# Patient Record
Sex: Female | Born: 1995 | Race: White | Hispanic: No | Marital: Married | Smoking: Never smoker
Health system: Southern US, Community
[De-identification: ages and names within clinical notes are randomized; demographics above are authoritative.]

## PROBLEM LIST (undated history)

## (undated) ENCOUNTER — Inpatient Hospital Stay (HOSPITAL_COMMUNITY): Payer: Self-pay

## (undated) DIAGNOSIS — Z5189 Encounter for other specified aftercare: Secondary | ICD-10-CM

## (undated) DIAGNOSIS — I10 Essential (primary) hypertension: Secondary | ICD-10-CM

## (undated) DIAGNOSIS — O09299 Supervision of pregnancy with other poor reproductive or obstetric history, unspecified trimester: Secondary | ICD-10-CM

## (undated) DIAGNOSIS — Z6841 Body Mass Index (BMI) 40.0 and over, adult: Secondary | ICD-10-CM

## (undated) DIAGNOSIS — O139 Gestational [pregnancy-induced] hypertension without significant proteinuria, unspecified trimester: Secondary | ICD-10-CM

## (undated) HISTORY — DX: Encounter for other specified aftercare: Z51.89

## (undated) HISTORY — DX: Supervision of pregnancy with other poor reproductive or obstetric history, unspecified trimester: O09.299

## (undated) HISTORY — PX: TONSILLECTOMY: SUR1361

## (undated) HISTORY — PX: KNEE ARTHROSCOPY WITH ANTERIOR CRUCIATE LIGAMENT (ACL) REPAIR: SHX5644

## (undated) HISTORY — PX: WISDOM TOOTH EXTRACTION: SHX21

## (undated) HISTORY — DX: Gestational (pregnancy-induced) hypertension without significant proteinuria, unspecified trimester: O13.9

## (undated) HISTORY — PX: KNEE SURGERY: SHX244

## (undated) HISTORY — DX: Essential (primary) hypertension: I10

---
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
## 2017-05-24 ENCOUNTER — Encounter: Payer: Self-pay | Admitting: *Deleted

## 2017-05-24 DIAGNOSIS — R1012 Left upper quadrant pain: Secondary | ICD-10-CM | POA: Insufficient documentation

## 2017-05-24 LAB — POCT PREGNANCY, URINE: PREG TEST UR: NEGATIVE

## 2017-05-24 NOTE — ED Triage Notes (Signed)
Pt to triage via wheelchair.  Pt has left side abd pain.  Sx for weeks, pain worse today.  No n/v/d.  No urinary sx.  No back pain.  Pt alert and tearful

## 2017-05-25 ENCOUNTER — Emergency Department
Admission: EM | Admit: 2017-05-25 | Discharge: 2017-05-25 | Disposition: A | Discharge status: Home / Self Care | Payer: BC Managed Care – PPO | Attending: Emergency Medicine | Admitting: Emergency Medicine

## 2017-05-25 ENCOUNTER — Emergency Department: Payer: BC Managed Care – PPO

## 2017-05-25 DIAGNOSIS — R109 Unspecified abdominal pain: Secondary | ICD-10-CM

## 2017-05-25 LAB — COMPREHENSIVE METABOLIC PANEL
ALT: 22 U/L (ref 14–54)
AST: 26 U/L (ref 15–41)
Albumin: 4.1 g/dL (ref 3.5–5.0)
Alkaline Phosphatase: 76 U/L (ref 38–126)
Anion gap: 10 (ref 5–15)
BUN: 13 mg/dL (ref 6–20)
CO2: 22 mmol/L (ref 22–32)
Calcium: 9.3 mg/dL (ref 8.9–10.3)
Chloride: 107 mmol/L (ref 101–111)
Creatinine, Ser: 0.63 mg/dL (ref 0.44–1.00)
GFR calc Af Amer: >60 mL/min (ref >60)
GFR calc non Af Amer: >60 mL/min (ref >60)
Glucose, Bld: 101 mg/dL — ABNORMAL HIGH (ref 65–99)
Potassium: 4 mmol/L (ref 3.5–5.1)
Sodium: 139 mmol/L (ref 135–145)
Total Bilirubin: 0.5 mg/dL (ref 0.3–1.2)
Total Protein: 7 g/dL (ref 6.5–8.1)

## 2017-05-25 LAB — URINALYSIS, COMPLETE (UACMP) WITH MICROSCOPIC
Bacteria, UA: NONE SEEN
Bilirubin Urine: NEGATIVE
Glucose, UA: NEGATIVE mg/dL
Hgb urine dipstick: NEGATIVE
Ketones, ur: NEGATIVE mg/dL
Leukocytes, UA: NEGATIVE
Nitrite: NEGATIVE
Protein, ur: NEGATIVE mg/dL
RBC / HPF: NONE SEEN RBC/hpf (ref 0–5)
Specific Gravity, Urine: 1.017 (ref 1.005–1.030)
WBC, UA: NONE SEEN WBC/hpf (ref 0–5)
pH: 6 (ref 5.0–8.0)

## 2017-05-25 LAB — CBC
HCT: 39.3 % (ref 35.0–47.0)
Hemoglobin: 13.3 g/dL (ref 12.0–16.0)
MCH: 27.5 pg (ref 26.0–34.0)
MCHC: 33.9 g/dL (ref 32.0–36.0)
MCV: 81.2 fL (ref 80.0–100.0)
Platelets: 229 10*3/uL (ref 150–440)
RBC: 4.84 MIL/uL (ref 3.80–5.20)
RDW: 14 % (ref 11.5–14.5)
WBC: 8.1 10*3/uL (ref 3.6–11.0)

## 2017-05-25 LAB — LIPASE, BLOOD: Lipase: 29 U/L (ref 11–51)

## 2017-05-25 NOTE — ED Notes (Signed)
Pt resting comfortably

## 2017-05-25 NOTE — Discharge Instructions (Signed)

## 2017-05-25 NOTE — ED Provider Notes (Signed)
[facility] Emergency Department Provider Note  ____________________________________________   First MD Initiated Contact with Patient 05/25/17 0114     (approximate)  I have reviewed the triage vital signs and the nursing notes.   HISTORY  Chief Complaint Abdominal Pain    HPI Pamela Munoz is a 21 y.o. female who presents for evaluation of acute onset left sided abdominal pain in the setting of 2 weeks of mild abdominal pain.  She reports that she has had some mild aching left sided abdominal pain that is dull and persistent but "does not bother me too much" for about 2 weeks.  Tonight, however, several hours prior to arrival she had acute onset of severe sharp and aching pain in the left side of her abdomen.  Nothing in particular makes the patient's symptoms better nor worse.  It feels better now but she can still feel the pain.  She denies fever/chills, chest pain, shortness of breath, nausea or vomiting, and dysuria.  She has had normal bowel movements recently with no diarrhea and no constipation.  She denies vaginal discharge.  Her last pressure.  Was 2 weeks ago.  She has never been sexually active.   No past medical history on file.  There are no active problems to display for this patient.   No past surgical history on file.  Prior to Admission medications   Not on File    Allergies Patient has no known allergies.  No family history on file.  Social History Social History  Substance Use Topics  . Smoking status: Never Smoker  . Smokeless tobacco: Never Used  . Alcohol use No    Review of Systems Constitutional: No fever/chills Eyes: No visual changes. ENT: No sore throat. Cardiovascular: Denies chest pain. Respiratory: Denies shortness of breath. Gastrointestinal: 2 weeks of mild left-sided abdominal pain with acute onset severe pain tonight, now improved but still present.  No nausea, no vomiting.  No diarrhea.  No  constipation. Genitourinary: Negative for dysuria.  No vaginal bleeding since last menstrual period 2 weeks ago. Musculoskeletal: Negative for neck pain.  Negative for back pain. Integumentary: Negative for rash. Neurological: Negative for headaches, focal weakness or numbness.   ____________________________________________   PHYSICAL EXAM:  VITAL SIGNS: ED Triage Vitals  Enc Vitals Group     BP 05/24/17 2344 119/85     Pulse Rate 05/24/17 2344 77     Resp 05/24/17 2344 18     Temp 05/24/17 2344 97.9 F (36.6 C)     Temp Source 05/24/17 2344 Oral     SpO2 05/24/17 2344 97 %     Weight 05/24/17 2344 90.7 kg (200 lb)     Height 05/24/17 2344 1.676 m (5\' 6" )     Head Circumference --      Peak Flow --      Pain Score 05/24/17 2343 8     Pain Loc --      Pain Edu? --      Excl. in [place]? --     Constitutional: Alert and oriented. Well appearing and in no acute distress. Eyes: Conjunctivae are normal.  Head: Atraumatic. Nose: No congestion/rhinnorhea. Mouth/Throat: Mucous membranes are moist. Neck: No stridor.  No meningeal signs.   Cardiovascular: Normal rate, regular rhythm. Good peripheral circulation. Grossly normal heart sounds. Respiratory: Normal respiratory effort.  No retractions. Lungs CTAB. Gastrointestinal: Soft with mild tenderness to palpation of the left lower abdomen without any specific focal tenderness.  No rebound  and no guarding GU:  deferred at patient/family preference Musculoskeletal: No lower extremity tenderness nor edema. No gross deformities of extremities.  No CVA tenderness Neurologic:  Normal speech and language. No gross focal neurologic deficits are appreciated.  Skin:  Skin is warm, dry and intact. No rash noted. Psychiatric: Mood and affect are normal. Speech and behavior are normal.  ____________________________________________   LABS (all labs ordered are listed, but only abnormal results are displayed)  Labs Reviewed  COMPREHENSIVE  METABOLIC PANEL - Abnormal; Notable for the following:       Result Value   Glucose, Bld 101 (*)    All other components within normal limits  URINALYSIS, COMPLETE (UACMP) WITH MICROSCOPIC - Abnormal; Notable for the following:    Color, Urine YELLOW (*)    APPearance CLEAR (*)    Squamous Epithelial / LPF 0-5 (*)    All other components within normal limits  LIPASE, BLOOD  CBC  POC URINE PREG, ED  POCT PREGNANCY, URINE   ____________________________________________  EKG  None - EKG not ordered by ED physician ____________________________________________  RADIOLOGY   US Pelvis Complete  Result Date: 05/25/2017 CLINICAL DATA:  Initial evaluation for acute left-sided abdominal pain for several weeks. EXAM: TRANSABDOMINAL ULTRASOUND OF PELVIS DOPPLER ULTRASOUND OF OVARIES TECHNIQUE: Transabdominal ultrasound examination of the pelvis was performed including evaluation of the uterus, ovaries, adnexal regions, and pelvic cul-de-sac. Color and duplex Doppler ultrasound was utilized to evaluate blood flow to the ovaries. Both transabdominal and transvaginal ultrasound examinations of the pelvis were performed. Transabdominal technique was performed for global imaging of the pelvis including uterus, ovaries, adnexal regions, and pelvic cul-de-sac. It was necessary to proceed with endovaginal exam following the transabdominal exam to visualize the uterus and ovaries. COMPARISON:  None. FINDINGS: Uterus Measurements: 6.9 x 2.9 x 4.1 cm. No fibroids or other mass visualized. Endometrium Thickness: 6.4 mm. No focal abnormality visualized. Right ovary Measurements: 5.1 x 2.3 x 3.2 cm. 2.7 x 2.5 x 2.5 cm simple anechoic cyst, most consistent with a normal physiologic cyst. Left ovary Measurements: 3.0 x 2.4 x 2.2 cm. Normal appearance/no adnexal mass. Pulsed Doppler evaluation demonstrates normal low-resistance arterial and venous waveforms in both ovaries. No abnormal free fluid within the pelvis.  IMPRESSION: 1. 2.7 x 2.5 x 2.5 cm simple right ovarian cyst, consistent with a normal physiologic cyst. 2. Otherwise unremarkable pelvic ultrasound. No acute abnormality identified. Electronically Signed   By: Jeannine Boga M.D.   On: 05/25/2017 03:33   [place] Art/ven Flow Abd Pelv Doppler  Result Date: 05/25/2017 CLINICAL DATA:  Initial evaluation for acute left-sided abdominal pain for several weeks. EXAM: TRANSABDOMINAL ULTRASOUND OF PELVIS DOPPLER ULTRASOUND OF OVARIES TECHNIQUE: Transabdominal ultrasound examination of the pelvis was performed including evaluation of the uterus, ovaries, adnexal regions, and pelvic cul-de-sac. Color and duplex Doppler ultrasound was utilized to evaluate blood flow to the ovaries. Both transabdominal and transvaginal ultrasound examinations of the pelvis were performed. Transabdominal technique was performed for global imaging of the pelvis including uterus, ovaries, adnexal regions, and pelvic cul-de-sac. It was necessary to proceed with endovaginal exam following the transabdominal exam to visualize the uterus and ovaries. COMPARISON:  None. FINDINGS: Uterus Measurements: 6.9 x 2.9 x 4.1 cm. No fibroids or other mass visualized. Endometrium Thickness: 6.4 mm. No focal abnormality visualized. Right ovary Measurements: 5.1 x 2.3 x 3.2 cm. 2.7 x 2.5 x 2.5 cm simple anechoic cyst, most consistent with a normal physiologic cyst. Left ovary Measurements: 3.0 x 2.4 x 2.2  cm. Normal appearance/no adnexal mass. Pulsed Doppler evaluation demonstrates normal low-resistance arterial and venous waveforms in both ovaries. No abnormal free fluid within the pelvis. IMPRESSION: 1. 2.7 x 2.5 x 2.5 cm simple right ovarian cyst, consistent with a normal physiologic cyst. 2. Otherwise unremarkable pelvic ultrasound. No acute abnormality identified. Electronically Signed   By: Jeannine Boga M.D.   On: 05/25/2017 03:33     ____________________________________________   PROCEDURES  Critical Care performed: No   Procedure(s) performed:   Procedures   ____________________________________________   INITIAL IMPRESSION / ASSESSMENT AND PLAN / ED COURSE  Pertinent labs & imaging results that were available during my care of the patient were reviewed by me and considered in my medical decision making (see chart for details).  Differential is broad as it is for any young woman with abdominal pain.  However the sure aspects of her situation are that she has been having pain for 2 weeks with acute onset worsening pain tonight, has normal vital signs, and has normal lab work.  We discussed various things that could be going  On and I explained that ovarian cause is most likely.  I ordered standard ultrasound including pelvis complete and transvaginal, but the patient has not had sexual intercourse and ultrasound protocol does not allow transvaginal ultrasound in that situation.  Awaiting results of transabdominal pelvic ultrasound.  Clinical Course as of May 25 452  Thu May 25, 2017  0420 The patient is sleeping comfortably.  When I woke her she states that she "just feels sick" but her stomach is not bothering her as much.  I reassessed her tenderness and she still has some mild tenderness to palpation of the far left lateral side of her abdomen but it remains nonfocal and she has no rebound or guarding.  I had another long discussion with her and her mother and I explained the possibility of things such as early appendicitis but I also explained why I did not think this was the case based on her normal vital signs including being afebrile, her normal labs, and the location of the pain.  Based on her reassuring ultrasound, I explained that I could go ahead and order a CT scan of her abdomen and pelvis or we could discharge for her to use ibuprofen and Tylenol and follow up as an outpatient with strict return  precautions if she were to develop new or worsening symptoms.  I also explained my concerns about the radiation risk for a 21 year old woman with a low probability of a severe intra-abdominal pathology.  Both the patient and her mother are comfortable with the plan for outpatient follow-up and promise to return if she worsens.  [CF]    Clinical Course User Index [CF] Hinda Kehr, MD    ____________________________________________  FINAL CLINICAL IMPRESSION(S) / ED DIAGNOSES  Final diagnoses:  Left sided abdominal pain     MEDICATIONS GIVEN DURING THIS VISIT:  Medications - No data to display   NEW OUTPATIENT MEDICATIONS STARTED DURING THIS VISIT:  There are no discharge medications for this patient.   There are no discharge medications for this patient.   There are no discharge medications for this patient.    Note:  This document was prepared using Dragon voice recognition software and may include unintentional dictation errors.    Hinda Kehr, MD 05/25/17 [phone]

## 2017-07-03 ENCOUNTER — Encounter: Payer: Self-pay | Admitting: Emergency Medicine

## 2017-07-03 ENCOUNTER — Ambulatory Visit
Admission: EM | Admit: 2017-07-03 | Discharge: 2017-07-03 | Disposition: A | Discharge status: Home / Self Care | Payer: BC Managed Care – PPO | Attending: Family Medicine | Admitting: Family Medicine

## 2017-07-03 ENCOUNTER — Encounter: Payer: Self-pay | Admitting: *Deleted

## 2017-07-03 DIAGNOSIS — M25561 Pain in right knee: Secondary | ICD-10-CM

## 2017-07-03 DIAGNOSIS — Z87828 Personal history of other (healed) physical injury and trauma: Secondary | ICD-10-CM

## 2017-07-03 NOTE — Discharge Instructions (Signed)
Rest. Ice. Use knee immobilizer and crutches. Keep follow-up appointment for tomorrow as scheduled.  Follow up with your primary care physician this week as needed. Return to Urgent care for new or worsening concerns.

## 2017-07-03 NOTE — ED Provider Notes (Signed)
[facility] ____________________________________________  Time seen: Approximately 9:05 AM  I have reviewed the triage vital signs and the nursing notes.   HISTORY  Chief Complaint Knee Pain (right)   HPI Pamela Munoz is a 21 y.o. female presenting with father at bedside for evaluation of right knee pain has been present for approximately 2 hours. Patient father reports that they were exercising and she was standing on top of a tire, and twisted her right knee causing her to fall. States when she twisted her knee she heard a loud popping noise, and then fell as she could not weight-bear. Denies any fall directly to the knee or direct trauma to the knee. States that she caught herself with her hands and then fell backwards in a sitting position. Patient states that she has been unable to weight-bear on her right knee. States history of previous anterior cruciate ligament repair in 2012, and post physical therapy no chronic issues. States current pain is similar to previous in regards to anterior cruciate ligament tear. Did take otc tylenol prior to arrival which helped some. States with knee sitting still only has minimal pain, states moderate pain with attempted weightbearing. Has been using crutches that she has a home.  Denies other pain or injury. Denies head injury or loss consciousness. Denies paresthesias or pain radiation. Denies other complaints. Denies recent sickness. Denies recent antibiotic use.   Patient's last menstrual period was 06/05/2017 (approximate).Denies pregnancy.    History reviewed. No pertinent past medical history. denies There are no active problems to display for this patient.   Past Surgical History:  Procedure Laterality Date  . KNEE ARTHROSCOPY WITH ANTERIOR CRUCIATE LIGAMENT (ACL) REPAIR Right   repaired by Rudene Christians   No current facility-administered medications for this encounter.  No current outpatient prescriptions on  file.  Allergies Patient has no known allergies.  History reviewed. No pertinent family history.  Social History Social History  Substance Use Topics  . Smoking status: Never Smoker  . Smokeless tobacco: Never Used  . Alcohol use No    Review of Systems Constitutional: No fever/chills Cardiovascular: Denies chest pain. Respiratory: Denies shortness of breath. Gastrointestinal: No abdominal pain.   Musculoskeletal: Negative for back pain. Skin: Negative for rash.  ____________________________________________   PHYSICAL EXAM:  VITAL SIGNS: ED Triage Vitals  Enc Vitals Group     BP 07/03/17 0843 133/63     Pulse Rate 07/03/17 0843 89     Resp 07/03/17 0843 14     Temp 07/03/17 0843 97.6 F (36.4 C)     Temp Source 07/03/17 0843 Oral     SpO2 07/03/17 0843 100 %     Weight 07/03/17 0841 220 lb (99.8 kg)     Height 07/03/17 0841 5\' 6"  (1.676 m)     Head Circumference --      Peak Flow --      Pain Score 07/03/17 0841 8     Pain Loc --      Pain Edu? --      Excl. in [place]? --     Constitutional: Alert and oriented. Well appearing and in no acute distress.      Head: Normocephalic and atraumatic. Cardiovascular: Normal rate, regular rhythm. Grossly normal heart sounds.  Good peripheral circulation. Respiratory: Normal respiratory effort without tachypnea nor retractions. Breath sounds are clear and equal bilaterally. No wheezes, rales, rhonchi. Musculoskeletal: No midline cervical, thoracic or lumbar tenderness to palpation. Bilateral pedal pulses equal and easily palpated. Except:  Right knee no erythema or edema noted on visualization, mild diffuse anterior tenderness palpation as well as posterior tenderness, moderate pain with anterior and posterior drawer test, no pain with lateral stress, mild pain with medial stress, no point bony tenderness uto palpation, nable to fully extend right knee, right lower extremity otherwise nontender and with normal distal  sensation. Neurologic:  Normal speech and language.Speech is normal.  Skin:  Skin is warm, dry. Psychiatric: Mood and affect are normal. Speech and behavior are normal. Patient exhibits appropriate insight and judgment   ___________________________________________   LABS (all labs ordered are listed, but only abnormal results are displayed)  Labs Reviewed - No data to display ____________________________________________  RADIOLOGY  No results found. _Declined ___________________________________________   PROCEDURES Procedures  right knee immobilizer applied by RN.  INITIAL IMPRESSION / ASSESSMENT AND PLAN / ED COURSE  Pertinent labs & imaging results that were available during my care of the patient were reviewed by me and considered in my medical decision making (see chart for details).  Well-appearing patient. Presents for evaluation of right knee pain that occurred prior to arrival. History of similar pain in past associated with anterior cruciate ligament tear. No point bony tenderness. Denies direct injury or hitting to right knee. States injury and pain secondary to twisting with associated popping injury. Discussed in detail with patient and mother, concerned suspect ligamentous injury. RN called and was able to obtain appointment with orthopedic Rachelle Hora PA tomorrow at 3 PM. Discussed the patient and father evaluation of x-ray today to exclude bony abnormality, patient and father declines xray at this time and states they will obtain x-ray tomorrow at orthopedist if needed. Knee immobilizer applied. Directed to continue using crutches. Denies need for pain medication. Ice and elevation and remain nonweightbearing area to work note given for today and tomorrow.  Discussed follow up with Primary care physician this week. Discussed follow up and return parameters including no resolution or any worsening concerns. Patient verbalized understanding and agreed to plan.    ____________________________________________   FINAL CLINICAL IMPRESSION(S) / ED DIAGNOSES  Final diagnoses:  Acute pain of right knee  History of tear of ACL (anterior cruciate ligament)     There are no discharge medications for this patient.   Note: This dictation was prepared with Dragon dictation along with smaller phrase technology. Any transcriptional errors that result from this process are unintentional.         Marylene Land, NP 07/03/17 1110

## 2017-07-03 NOTE — ED Triage Notes (Signed)
Patient c/o pain in her right knee after doing some excercise and twisted her knee wrong this morning.

## 2017-07-05 ENCOUNTER — Other Ambulatory Visit: Payer: Self-pay | Admitting: Orthopedic Surgery

## 2017-07-05 DIAGNOSIS — M25561 Pain in right knee: Secondary | ICD-10-CM

## 2017-07-06 ENCOUNTER — Ambulatory Visit
Admission: RE | Admit: 2017-07-06 | Discharge: 2017-07-06 | Disposition: A | Discharge status: Home / Self Care | Payer: BC Managed Care – PPO | Source: Ambulatory Visit | Attending: Orthopedic Surgery | Admitting: Orthopedic Surgery

## 2017-07-06 DIAGNOSIS — M25561 Pain in right knee: Secondary | ICD-10-CM | POA: Insufficient documentation

## 2017-07-06 DIAGNOSIS — S83421A Sprain of lateral collateral ligament of right knee, initial encounter: Secondary | ICD-10-CM | POA: Insufficient documentation

## 2017-07-06 DIAGNOSIS — M6751 Plica syndrome, right knee: Secondary | ICD-10-CM | POA: Diagnosis not present

## 2017-07-06 DIAGNOSIS — Z9889 Other specified postprocedural states: Secondary | ICD-10-CM | POA: Insufficient documentation

## 2017-07-06 DIAGNOSIS — M25461 Effusion, right knee: Secondary | ICD-10-CM | POA: Diagnosis present

## 2017-07-06 DIAGNOSIS — S83241A Other tear of medial meniscus, current injury, right knee, initial encounter: Secondary | ICD-10-CM | POA: Diagnosis not present

## 2017-07-14 ENCOUNTER — Other Ambulatory Visit: Payer: Self-pay | Admitting: Orthopedic Surgery

## 2017-07-14 DIAGNOSIS — M25561 Pain in right knee: Secondary | ICD-10-CM

## 2017-07-21 ENCOUNTER — Ambulatory Visit
Admission: RE | Admit: 2017-07-21 | Discharge: 2017-07-21 | Disposition: A | Discharge status: Home / Self Care | Payer: BC Managed Care – PPO | Source: Ambulatory Visit | Attending: Orthopedic Surgery | Admitting: Orthopedic Surgery

## 2017-07-21 DIAGNOSIS — M25561 Pain in right knee: Secondary | ICD-10-CM | POA: Insufficient documentation

## 2017-07-21 DIAGNOSIS — Z9689 Presence of other specified functional implants: Secondary | ICD-10-CM | POA: Diagnosis not present

## 2017-07-21 DIAGNOSIS — Z9889 Other specified postprocedural states: Secondary | ICD-10-CM | POA: Insufficient documentation

## 2017-07-26 ENCOUNTER — Encounter: Payer: Self-pay | Admitting: *Deleted

## 2017-07-26 ENCOUNTER — Ambulatory Visit: Payer: BC Managed Care – PPO

## 2017-08-03 ENCOUNTER — Encounter: Payer: Self-pay | Admitting: Anesthesiology

## 2017-08-03 ENCOUNTER — Ambulatory Visit: Payer: BC Managed Care – PPO | Admitting: Anesthesiology

## 2017-08-03 ENCOUNTER — Encounter
Admission: RE | Disposition: A | Discharge status: Home / Self Care | Payer: Self-pay | Source: Ambulatory Visit | Attending: Orthopedic Surgery

## 2017-08-03 ENCOUNTER — Ambulatory Visit
Admission: RE | Admit: 2017-08-03 | Discharge: 2017-08-03 | Disposition: A | Discharge status: Home / Self Care | Payer: BC Managed Care – PPO | Source: Ambulatory Visit | Attending: Orthopedic Surgery | Admitting: Orthopedic Surgery

## 2017-08-03 DIAGNOSIS — Y93B9 Activity, other involving muscle strengthening exercises: Secondary | ICD-10-CM | POA: Insufficient documentation

## 2017-08-03 DIAGNOSIS — S83211A Bucket-handle tear of medial meniscus, current injury, right knee, initial encounter: Secondary | ICD-10-CM | POA: Insufficient documentation

## 2017-08-03 DIAGNOSIS — Y9289 Other specified places as the place of occurrence of the external cause: Secondary | ICD-10-CM | POA: Insufficient documentation

## 2017-08-03 DIAGNOSIS — S83511A Sprain of anterior cruciate ligament of right knee, initial encounter: Secondary | ICD-10-CM | POA: Insufficient documentation

## 2017-08-03 DIAGNOSIS — Z6841 Body Mass Index (BMI) 40.0 and over, adult: Secondary | ICD-10-CM | POA: Insufficient documentation

## 2017-08-03 DIAGNOSIS — X500XXA Overexertion from strenuous movement or load, initial encounter: Secondary | ICD-10-CM | POA: Diagnosis not present

## 2017-08-03 HISTORY — DX: Morbid (severe) obesity due to excess calories: E66.01

## 2017-08-03 HISTORY — DX: Body Mass Index (BMI) 40.0 and over, adult: Z684

## 2017-08-03 HISTORY — PX: ANTERIOR CRUCIATE LIGAMENT REPAIR: SHX115

## 2017-08-03 HISTORY — PX: KNEE ARTHROSCOPY: SHX127

## 2017-08-03 SURGERY — ARTHROSCOPY, KNEE
Anesthesia: General | Site: Knee | Laterality: Right | Wound class: Clean

## 2017-08-03 MED ORDER — MIDAZOLAM HCL 5 MG/5ML IJ SOLN
INTRAMUSCULAR | Status: DC | PRN
  Administered 2017-08-03: 2 mg via INTRAVENOUS

## 2017-08-03 MED ORDER — OXYCODONE HCL 5 MG/5ML PO SOLN: 5.0000 mg | Freq: Once | ORAL | Status: DC | PRN

## 2017-08-03 MED ORDER — OXYCODONE HCL 5 MG PO TABS
10.0000 mg | ORAL_TABLET | Freq: Once | ORAL | Status: AC | PRN
  Administered 2017-08-03: 10 mg via ORAL

## 2017-08-03 MED ORDER — BUPIVACAINE HCL 0.5 % IJ SOLN: INTRAMUSCULAR | Status: DC | PRN

## 2017-08-03 MED ORDER — OXYCODONE HCL 5 MG PO TABS
5.0000 mg | ORAL_TABLET | Freq: Once | ORAL | Status: DC | PRN
  Administered 2017-08-03: 10 mg via ORAL

## 2017-08-03 MED ORDER — ACETAMINOPHEN 10 MG/ML IV SOLN
1000.0000 mg | Freq: Once | INTRAVENOUS | Status: AC
  Administered 2017-08-03: 1000 mg via INTRAVENOUS

## 2017-08-03 MED ORDER — HYDROMORPHONE HCL 1 MG/ML IJ SOLN
0.5000 mg | INTRAMUSCULAR | Status: DC | PRN
  Administered 2017-08-03: 0.5 mg via INTRAVENOUS

## 2017-08-03 MED ORDER — PROMETHAZINE HCL 25 MG/ML IJ SOLN: 6.2500 mg | INTRAMUSCULAR | Status: DC | PRN

## 2017-08-03 MED ORDER — LIDOCAINE-EPINEPHRINE 1 %-1:100000 IJ SOLN
INTRAMUSCULAR | Status: DC | PRN
  Administered 2017-08-03: 2.5 mL

## 2017-08-03 MED ORDER — LACTATED RINGERS IV SOLN
10.0000 mL/h | INTRAVENOUS | Status: DC
  Administered 2017-08-03 (×2): 10 mL/h via INTRAVENOUS

## 2017-08-03 MED ORDER — ONDANSETRON HCL 4 MG/2ML IJ SOLN
INTRAMUSCULAR | Status: DC | PRN
  Administered 2017-08-03: 4 mg via INTRAVENOUS

## 2017-08-03 MED ORDER — DEXAMETHASONE SODIUM PHOSPHATE 4 MG/ML IJ SOLN
INTRAMUSCULAR | Status: DC | PRN
  Administered 2017-08-03: 8 mg via INTRAVENOUS

## 2017-08-03 MED ORDER — PROPOFOL 10 MG/ML IV BOLUS
INTRAVENOUS | Status: DC | PRN
  Administered 2017-08-03: 200 mg via INTRAVENOUS

## 2017-08-03 MED ORDER — ACETAMINOPHEN 325 MG PO TABS: 325.0000 mg | ORAL_TABLET | ORAL | Status: DC | PRN

## 2017-08-03 MED ORDER — ACETAMINOPHEN 160 MG/5ML PO SOLN: 325.0000 mg | ORAL | Status: DC | PRN

## 2017-08-03 MED ORDER — CELECOXIB 200 MG PO CAPS: 200.0000 mg | ORAL_CAPSULE | Freq: Once | ORAL | Status: DC

## 2017-08-03 MED ORDER — OXYCODONE HCL 5 MG/5ML PO SOLN: 5.0000 mg | Freq: Once | ORAL | Status: AC | PRN

## 2017-08-03 MED ORDER — FENTANYL CITRATE (PF) 100 MCG/2ML IJ SOLN
25.0000 ug | INTRAMUSCULAR | Status: DC | PRN
  Administered 2017-08-03: 25 ug via INTRAVENOUS

## 2017-08-03 MED ORDER — KETOROLAC TROMETHAMINE 30 MG/ML IJ SOLN
INTRAMUSCULAR | Status: DC | PRN
  Administered 2017-08-03: 30 mg via INTRAVENOUS

## 2017-08-03 MED ORDER — GABAPENTIN 600 MG PO TABS
300.0000 mg | ORAL_TABLET | Freq: Once | ORAL | Status: AC
  Administered 2017-08-03: 300 mg via ORAL

## 2017-08-03 MED ORDER — CEFAZOLIN SODIUM-DEXTROSE 2-4 GM/100ML-% IV SOLN
2.0000 g | Freq: Once | INTRAVENOUS | Status: AC
  Administered 2017-08-03: 2 g via INTRAVENOUS

## 2017-08-03 MED ORDER — ROPIVACAINE HCL 5 MG/ML IJ SOLN
INTRAMUSCULAR | Status: DC | PRN
  Administered 2017-08-03: 30 mL via PERINEURAL

## 2017-08-03 MED ORDER — LIDOCAINE HCL (CARDIAC) 20 MG/ML IV SOLN
INTRAVENOUS | Status: DC | PRN
  Administered 2017-08-03: 50 mg via INTRATRACHEAL

## 2017-08-03 MED ORDER — FENTANYL CITRATE (PF) 100 MCG/2ML IJ SOLN
INTRAMUSCULAR | Status: DC | PRN
  Administered 2017-08-03: 50 ug via INTRAVENOUS
  Administered 2017-08-03: 100 ug via INTRAVENOUS

## 2017-08-03 MED ORDER — SCOPOLAMINE 1 MG/3DAYS TD PT72: 1.0000 | MEDICATED_PATCH | Freq: Once | TRANSDERMAL | Status: DC

## 2017-08-03 SURGICAL SUPPLY — 40 items
AIR ALL-INSIDE MENISCAL REPAIR DEVICE ×3 IMPLANT
AIR ALL-INSIDEMENISCAL REPAIR DEVICE ×6 IMPLANT
Air All-inside meniscal repair device ×2 IMPLANT
BLADE SURG 15 STRL LF DISP TIS (BLADE) ×2 IMPLANT
BLADE SURG 15 STRL SS (BLADE) ×4
BLADE SURG SZ11 CARB STEEL (BLADE) ×4 IMPLANT
BNDG COHESIVE 4X5 TAN STRL (GAUZE/BANDAGES/DRESSINGS) ×4 IMPLANT
BUR RADIUS 3.5 (BURR) ×4 IMPLANT
CHLORAPREP W/TINT 26ML (MISCELLANEOUS) ×4 IMPLANT
COOLER POLAR GLACIER W/PUMP (MISCELLANEOUS) ×4 IMPLANT
CUFF TOURN SGL QUICK 34 (TOURNIQUET CUFF) ×4
CUFF TRNQT CYL 34X4X40X1 (TOURNIQUET CUFF) ×2 IMPLANT
DRAPE IMP U-DRAPE 54X76 (DRAPES) ×4 IMPLANT
DRAPE SHEET LG 3/4 BI-LAMINATE (DRAPES) ×6 IMPLANT
FASTFIX NDL DEL SYS 360 CVD (Miscellaneous) ×16 IMPLANT
GAUZE SPONGE 4X4 12PLY STRL (GAUZE/BANDAGES/DRESSINGS) ×4 IMPLANT
GLOVE BIO SURGEON STRL SZ7.5 (GLOVE) ×10 IMPLANT
GLOVE BIOGEL PI IND STRL 8 (GLOVE) ×4 IMPLANT
GLOVE BIOGEL PI INDICATOR 8 (GLOVE) ×6
GOWN STRL REUS W/ TWL LRG LVL3 (GOWN DISPOSABLE) ×3 IMPLANT
GOWN STRL REUS W/TWL LRG LVL3 (GOWN DISPOSABLE) ×12 IMPLANT
IV LACTATED RINGER IRRG 3000ML (IV SOLUTION) ×136
IV LR IRRIG 3000ML ARTHROMATIC (IV SOLUTION) ×38 IMPLANT
KIT RM TURNOVER STRD PROC AR (KITS) ×4 IMPLANT
MAT BLUE FLOOR 46X72 FLO (MISCELLANEOUS) ×10 IMPLANT
NEPTUNE MANIFOLD (MISCELLANEOUS) ×4 IMPLANT
PACK ARTHROSCOPY KNEE (MISCELLANEOUS) ×4 IMPLANT
PAD ABD DERMACEA PRESS 5X9 (GAUZE/BANDAGES/DRESSINGS) ×5 IMPLANT
PAD WRAPON POLAR KNEE (MISCELLANEOUS) ×2 IMPLANT
PADDING CAST BLEND 6X4 STRL (MISCELLANEOUS) ×2 IMPLANT
PADDING STRL CAST 6IN (MISCELLANEOUS) ×4
SET TUBE SUCT SHAVER OUTFL 24K (TUBING) ×4 IMPLANT
SUT ETHILON 3-0 FS-10 30 BLK (SUTURE) ×4
SUT ETHILON 4-0 (SUTURE) ×4
SUT ETHILON 4-0 FS2 18XMFL BLK (SUTURE) ×2
SUTURE EHLN 3-0 FS-10 30 BLK (SUTURE) ×1 IMPLANT
SUTURE ETHLN 4-0 FS2 18XMF BLK (SUTURE) ×2 IMPLANT
SYSTEM NDL DEL FSTFX  360 CVD (Miscellaneous) ×4 IMPLANT
TUBING ARTHRO INFLOW-ONLY STRL (TUBING) ×4 IMPLANT
WRAPON POLAR PAD KNEE (MISCELLANEOUS) ×4

## 2017-08-03 NOTE — Transfer of Care (Signed)
Immediate Anesthesia Transfer of Care Note  Patient: Pamela Munoz  Procedure(s) Performed: Procedure(s) with comments: ARTHROSCOPY KNEERIGHT MEDIAL MENISCUS REPAIR VERSUS PARTIAL MENISCECTOMY (Right) - MTF CANNULATED DOWELS- WES CONN STRYKER VERSITOMIC REAMERS +/- INJECTABLE MAP3 [place] FRYE ARTHREX DILATORS CORING REAMERS +/- DBM ALEX ROBERTS BIOMET REMOVAL DEVICE (POSSIBLE) (UNKNOWN REP-TRYING TO FIND OUT 70 DEGREE ARTHROSCOPE HARDWARE REMOVAL OF FEMORAL AND BONE GRAFTING OF TIBIAL AND FEMORAL TUNNELS (Right)  Patient Location: PACU  Anesthesia Type: General  Level of Consciousness: awake, alert  and patient cooperative  Airway and Oxygen Therapy: Patient Spontanous Breathing and Patient connected to supplemental oxygen  Post-op Assessment: Post-op Vital signs reviewed, Patient's Cardiovascular Status Stable, Respiratory Function Stable, Patent Airway and No signs of Nausea or vomiting  Post-op Vital Signs: Reviewed and stable  Complications: No apparent anesthesia complications

## 2017-08-03 NOTE — Anesthesia Procedure Notes (Signed)
Procedure Name: LMA Insertion Date/Time: 08/03/2017 12:00 PM Performed by: Londell Moh Pre-anesthesia Checklist: Patient identified, Emergency Drugs available, Suction available, Timeout performed and Patient being monitored Patient Re-evaluated:Patient Re-evaluated prior to induction Oxygen Delivery Method: Circle system utilized Preoxygenation: Pre-oxygenation with 100% oxygen Induction Type: IV induction LMA: LMA inserted LMA Size: 4.0 Number of attempts: 1 Placement Confirmation: positive ETCO2 and breath sounds checked- equal and bilateral Tube secured with: Tape

## 2017-08-03 NOTE — Anesthesia Preprocedure Evaluation (Addendum)
Anesthesia Evaluation  Patient identified by MRN, date of birth, ID band Patient awake    Reviewed: Allergy & Precautions, NPO status   Airway Mallampati: II  TM Distance: >3 FB     Dental no notable dental hx.    Pulmonary neg pulmonary ROS, neg recent URI,    breath sounds clear to auscultation       Cardiovascular negative cardio ROS   Rhythm:Regular Rate:Normal     Neuro/Psych    GI/Hepatic negative GI ROS,   Endo/Other  Morbid obesity (BMI 43)  Renal/GU      Musculoskeletal   Abdominal   Peds  Hematology   Anesthesia Other Findings   Reproductive/Obstetrics                           Anesthesia Physical Anesthesia Plan  ASA: II  Anesthesia Plan: General   Post-op Pain Management:  Regional for Post-op pain   Induction:   PONV Risk Score and Plan:   Airway Management Planned:   Additional Equipment:   Intra-op Plan:   Post-operative Plan:   Informed Consent: I have reviewed the patients History and Physical, chart, labs and discussed the procedure including the risks, benefits and alternatives for the proposed anesthesia with the patient or authorized representative who has indicated his/her understanding and acceptance.   Dental advisory given  Plan Discussed with: CRNA  Anesthesia Plan Comments:         Anesthesia Quick Evaluation

## 2017-08-03 NOTE — H&P (Signed)
Paper H&P to be scanned into permanent record. H&P reviewed. No significant changes noted.  

## 2017-08-03 NOTE — Op Note (Addendum)
Operative Note   SURGERY DATE: 08/03/2017  PRE-OP DIAGNOSIS:  1. Right medial meniscus tear 2. ACL deficiency  POST-OP DIAGNOSIS: 1. Right medial meniscus tear 2. ACL deficiency  PROCEDURES:  1. Right knee arthroscopy, medial meniscus repair 2. ACL repair  SURGEON: Cato Mulligan, MD  ANESTHESIA: Gen  ESTIMATED BLOOD LOSS:minimal  TOTAL IV FLUIDS: per anesthesia  INDICATION(S): Pamela Munoz is a 21 y.o. female who had a prior R ACL reconstruction performed by Dr. Rudene Christians ~5 years ago. She did well after this surgery until 07/03/17 when she was working out and stepped incorrectly on a tire. She felt a pop and an MRI showed a medial meniscus tear with an ACL sprain. She had some laxity of her ACL on exam so the plan was to perform a 2-stage ACL reconstruction with this being Stage 1 in which we addressed the meniscus, removed hardware, and bone grafted her tunnels.   OPERATIVE FINDINGS:   Examination under anesthesia:A careful examination under anesthesia was performed. Passive range of motion was: Hyperextension: 0. Extension: 0. Flexion: 120. Lachman: laxity. Pivot Shift: normal. Posterior drawer: normal. Varus stability in full extension: normal. Varus stability in 30 degrees of flexion: normal. Valgus stability in full extension: normal. Valgus stability in 30 degrees of flexion: normal.  Intra-operative findings:A thorough arthroscopic examination of the knee was performed. The findings are: 1. Suprapatellar pouch: Normal 2. Undersurface of median ridge: Normal 3. Medial patellar facet: normal 4. Lateral patellar facet: normal 5. Trochlea: normal 6. Lateral gutter/popliteus tendon: Normal 7. Hoffa's fat pad: Inflamed 8. Medial gutter/plica: Normal 9. ACL: no graft incorporation, both strands of allograft tendon visible with laxity on probing. No complete tear as there is continuity of graft from femoral tunnel to tibial tunnel  10. PCL: Normal 11. Medial  meniscus: Large bucket handle medial meniscus tear 12. Medial compartment cartilage: Grade 1-2 degenerative changes to femur and tibia 13. Lateral meniscus: normal 14. Lateral compartment cartilage: Normal  OPERATIVE REPORT:   I identified Pamela Munoz the pre-operative holding area. I marked theoperativeknee with my initials. I reviewed the risks and benefits of the proposed surgical intervention, and the patient (and/or patient's guardian) wished to proceed. The patient was transferred to the operative suite and placed in the supine position with all bony prominences padded. Anesthesia was administered. Appropriate IV antibioticswere administered within 30 minutes of incision. The extremity was then prepped and draped in standard fashion. A time out was performed confirming the correct extremity, correct patient, and correct procedure.  Arthroscopy portals were marked. Local anesthetic was injected to the planned portal sites. The anterolateral portalwasestablished with an 11blade followed by asuperolateralportal to provide for outflow of the joint. The arthroscope was placed in the anterolateral portal and theninto the suprapatellar pouch. A diagnostic knee scope was completed with the above findings.   The medial meniscus tear was identified as a bucket handle meniscus tear. Next the medial portal was established under needle localization. The meniscal tear was reduced with a probe. A meniscal rasp was used to rasp the meniscus and capsular tissue to promote healing. All-inside devices (a combination of FastFix 360 and Stryker AIR) were used to reduce the torn meniscus to the stable rim and capsule. An accessory lateral portal was used to place sutures on the anterior most aspect of the tear at the meniscus body. A total of 8 stitches were used. The meniscus was probed and felt to be stable.  Given that the ACL had some laxity intraoperatively, there was  no complete rupture as  there was graft going in both the tibial and femoral tunnels, negative pivot shift, her lack of incorporation of the graft, and the fact that she did well for 5 years post-operatively after ACL reconstruction, I decided against a 2-stage reconstruction and elected to perform an ACL repair. The femoral hardware was identified in the femoral tunnel and it was not protruding into the intercondylar notch. We made an accessory medial portal under needle localization. A double-loaded 2.108mm Q-fix device was placed into the medial aspect of the lateral femoral condyle just anterior to her existing femoral ACL tunnel. One strand of each suture was passed around the ACL with a FirstPass suture passing device. Each strand was then tied down with an arthroscopic knot pusher such that there was almost circumferential compression and tightening of the ACL. Knots were cut with a knot cutter. The ACL was then probed and felt to be significantly more stable. Additionally, there was no impingement with the knee in full extension. I then used a microfracture awl to microfracture the intercondylar notch to promote healing.   Arthroscopic fluid was removed from the joint. The portals were closed with 3-0 Nylon suture. Sterile dressings included Xeroform, 4x4s, Sof-Rol, and Bias wrap. A Polarcare was placed.  A hinged knee brace locked in extension was placed. The patient was then awakened and taken to the PACU hemodynamically stable without complication.   POSTOPERATIVE PLAN: The patient will be discharged home today once they meet PACU criteria. Aspirin 325 mg daily was prescribed for 4 weeks for DVT prophylaxis. Physical therapy will start on POD#3-4.Non-Weight-bearing x 6 weeks. They will follow up in 2 weeks per protocol. Of note, there is now no longer a plan for a 2nd stage surgical procedure.

## 2017-08-03 NOTE — Anesthesia Procedure Notes (Signed)
Anesthesia Regional Block: Adductor canal block   Pre-Anesthetic Checklist: ,, timeout performed, Correct Patient, Correct Site, Correct Laterality, Correct Procedure, Correct Position, site marked, Risks and benefits discussed,  Surgical consent,  Pre-op evaluation,  At surgeon's request and post-op pain management  Laterality: Right  Prep: chloraprep       Needles:  Injection technique: Single-shot  Needle Type: Stimiplex     Needle Length: 10cm      Additional Needles:   Procedures:,,,, ultrasound used (permanent image in chart),,,,  Narrative:  Start time: 08/03/2017 11:13 AM End time: 08/03/2017 11:18 AM Injection made incrementally with aspirations every 5 mL.  Performed by: Personally  Anesthesiologist: Veda Canning  Additional Notes: ACB with 30 mL ropivacaine. Good visualization, negative aspiration, tolerated well.  Veda Canning, MD

## 2017-08-03 NOTE — Discharge Instructions (Signed)
Arthroscopic Knee Surgery - Meniscus Repair  Post-Op Instructions  1. Bracing or crutches: Crutches will be provided at the time of discharge from the surgery center.   2. Ice: You may be provided with a device [facility]) that allows you to ice the affected area effectively. Otherwise you can ice manually.   3. Driving:  Plan on not driving for at least four weeks. Please note that you are advised NOT to drive while taking narcotic pain medications as you may be impaired and unsafe to drive.  4. Activity: Ankle pumps several times an hour while awake to prevent blood clots. Weight bearing: NON weight bearing. Use crutches for 6 weeks. Bending and straightening the knee is unlimited, but do not flex your knee past 90 degrees until cleared by your therapist. Elevate knee above heart level as much as possible for one week. Avoid standing more than 5 minutes (consecutively) for the first week. No exercise involving the knee until cleared by the surgeon or physical therapist.  Avoid long distance travel for 4 weeks.  5. Medications:  - You have been provided a prescription for narcotic pain medicine. After surgery, take 1-2 narcotic tablets every 4 hours if needed for severe pain.  - A prescription for anti-nausea medication will be provided in case the narcotic medicine causes nausea - take 1 tablet every 6 hours only if nauseated.  - Take ibuprofen 600 mg every 6 hours with food to reduce post-operative knee swelling. DO NOT STOP IBUPROFEN POST-OP UNTIL INSTRUCTED TO DO SO at first post-op office visit (10-14 days after surgery).  - Take enteric coated aspirin 325 mg once daily for 4 weeks to prevent blood clots.  -Take tylenol 1000 mg every 8 hours for pain.  May stop tylenol 3 days after surgery if you are having minimal pain.  If you are taking prescription medication for anxiety, depression, insomnia, muscle spasm, chronic pain, or for attention deficit disorder you are advised that you  are at a higher risk of adverse effects with use of narcotics post-op, including narcotic addiction/dependence, depressed breathing, death. If you use non-prescribed substances: alcohol, marijuana, cocaine, heroin, methamphetamines, etc., you are at a higher risk of adverse effects with use of narcotics post-op, including narcotic addiction/dependence, depressed breathing, death. You are advised that taking > 50 morphine milligram equivalents (MME) of narcotic pain medication per day results in twice the risk of overdose or death. For your prescription provided: oxycodone 5 mg - taking more than 6 tablets per day. Be advised that we will prescribe narcotics short-term, for acute post-operative pain only - 1 week for minor operations such as knee arthroscopy for meniscus tear resection, and 3 weeks for major operations such as knee repair/reconstruction surgeries.   6. Bandages: The physical therapist should change the bandages at the first post-op appointment. If needed, the dressing supplies have been provided to you.  7. Physical Therapy: 2 times per week for the first 4 weeks, then 1-2 times per week from weeks 4-8 post-op. Therapy typically starts on post operative Day 3 or 4. You have been provided an order for physical therapy. The therapist will provide home exercises.  8. Work: May return to full work when off of crutches. May do light duty/desk job in approximately 1-2 weeks when off of narcotics, pain is well-controlled, and swelling has decreased.  9. Post-Op Appointments: Your first post-op appointment will be with Dr. Posey Pronto in approximately 2 weeks time.   If you find that they have not  been scheduled please call the Orthopaedic Appointment front desk at [phone].     General Anesthesia, Adult, Care After These instructions provide you with information about caring for yourself after your procedure. Your health care provider may also give you more specific instructions. Your  treatment has been planned according to current medical practices, but problems sometimes occur. Call your health care provider if you have any problems or questions after your procedure. What can I expect after the procedure? After the procedure, it is common to have:  Vomiting.  A sore throat.  Mental slowness.  It is common to feel:  Nauseous.  Cold or shivery.  Sleepy.  Tired.  Sore or achy, even in parts of your body where you did not have surgery.  Follow these instructions at home: For at least 24 hours after the procedure:  Do not: ? Participate in activities where you could fall or become injured. ? Drive. ? Use heavy machinery. ? Drink alcohol. ? Take sleeping pills or medicines that cause drowsiness. ? Make important decisions or sign legal documents. ? Take care of children on your own.  Rest. Eating and drinking  If you vomit, drink water, juice, or soup when you can drink without vomiting.  Drink enough fluid to keep your urine clear or pale yellow.  Make sure you have little or no nausea before eating solid foods.  Follow the diet recommended by your health care provider. General instructions  Have a responsible adult stay with you until you are awake and alert.  Return to your normal activities as told by your health care provider. Ask your health care provider what activities are safe for you.  Take over-the-counter and prescription medicines only as told by your health care provider.  If you smoke, do not smoke without supervision.  Keep all follow-up visits as told by your health care provider. This is important. Contact a health care provider if:  You continue to have nausea or vomiting at home, and medicines are not helpful.  You cannot drink fluids or start eating again.  You cannot urinate after 8-12 hours.  You develop a skin rash.  You have fever.  You have increasing redness at the site of your procedure. Get help right  away if:  You have difficulty breathing.  You have chest pain.  You have unexpected bleeding.  You feel that you are having a life-threatening or urgent problem. This information is not intended to replace advice given to you by your health care provider. Make sure you discuss any questions you have with your health care provider. Document Released: 02/06/2001 Document Revised: 04/04/2016 Document Reviewed: 10/15/2015 Elsevier Interactive Patient Education  [employer].

## 2017-08-04 ENCOUNTER — Encounter: Payer: Self-pay | Admitting: Orthopedic Surgery

## 2017-08-04 MED ORDER — LIDOCAINE-EPINEPHRINE 1 %-1:100000 IJ SOLN
INTRAMUSCULAR | Status: DC | PRN
  Administered 2017-08-03: 4 mL

## 2017-08-04 MED ORDER — BUPIVACAINE-EPINEPHRINE 0.5% -1:200000 IJ SOLN
INTRAMUSCULAR | Status: DC | PRN
  Administered 2017-08-03: 2.5 mL
  Administered 2017-08-03: 4 mL

## 2017-08-04 NOTE — Anesthesia Postprocedure Evaluation (Signed)
Anesthesia Post Note  Patient: Pamela Munoz  Procedure(s) Performed: Procedure(s) (LRB): ARTHROSCOPY KNEERIGHT MEDIAL MENISCUS REPAIR VERSUS PARTIAL MENISCECTOMY (Right) HARDWARE REMOVAL OF FEMORAL AND BONE GRAFTING OF TIBIAL AND FEMORAL TUNNELS (Right)  Patient location during evaluation: PACU Anesthesia Type: General Level of consciousness: awake Pain management: pain level controlled Vital Signs Assessment: post-procedure vital signs reviewed and stable Respiratory status: respiratory function stable Cardiovascular status: stable Postop Assessment: no signs of nausea or vomiting Anesthetic complications: no    Veda Canning

## 2018-06-18 ENCOUNTER — Encounter: Payer: Self-pay | Admitting: Emergency Medicine

## 2018-06-18 ENCOUNTER — Other Ambulatory Visit: Payer: Self-pay

## 2018-06-18 ENCOUNTER — Ambulatory Visit
Admission: EM | Admit: 2018-06-18 | Discharge: 2018-06-18 | Disposition: A | Discharge status: Home / Self Care | Payer: BC Managed Care – PPO | Attending: Family Medicine | Admitting: Family Medicine

## 2018-06-18 DIAGNOSIS — S39012A Strain of muscle, fascia and tendon of lower back, initial encounter: Secondary | ICD-10-CM

## 2018-06-18 MED ORDER — CYCLOBENZAPRINE HCL 10 MG PO TABS: 10.0000 mg | ORAL_TABLET | Freq: Every day | ORAL | 0 refills | Status: DC

## 2018-06-18 MED ORDER — MELOXICAM 15 MG PO TABS: 15.0000 mg | ORAL_TABLET | Freq: Every day | ORAL | 0 refills | Status: DC

## 2018-06-18 NOTE — ED Provider Notes (Signed)
[facility]    CSN: [id number] Arrival date & time: 06/18/18  1453     History   Chief Complaint Chief Complaint  Patient presents with  . Back Pain    HPI Pamela Munoz is a 22 y.o. female.   The history is provided by the patient.  Back Pain  Location:  Lumbar spine Quality:  Aching Radiates to:  Does not radiate Pain severity:  Moderate Pain is:  Same all the time Onset quality:  Sudden Duration:  5 days Timing:  Constant Progression:  Unchanged Chronicity:  New Context: twisting (kayacking)   Context: not emotional stress, not falling, not jumping from heights, not lifting heavy objects, not MCA, not MVA, not occupational injury, not pedestrian accident, not physical stress, not recent illness and not recent injury   Relieved by:  Nothing Worsened by:  Twisting and bending Ineffective treatments:  OTC medications Associated symptoms: no abdominal pain, no abdominal swelling, no bladder incontinence, no bowel incontinence, no chest pain, no dysuria, no fever, no headaches, no leg pain, no numbness, no paresthesias, no pelvic pain, no perianal numbness, no tingling, no weakness and no weight loss   Risk factors: obesity   Risk factors: no hx of cancer, no hx of osteoporosis, no lack of exercise, no menopause, not pregnant, no recent surgery, no steroid use and no vascular disease     Past Medical History:  Diagnosis Date  . Morbid obesity with BMI of 40.0-44.9, adult ([place])     There are no active problems to display for this patient.   Past Surgical History:  Procedure Laterality Date  . ANTERIOR CRUCIATE LIGAMENT REPAIR Right 08/03/2017   Procedure: ANTERIOR CRUCIATE LIGAMENT (ACL) REPAIR;  Surgeon: Leim Fabry, MD;  Location: [facility];  Service: Orthopedics;  Laterality: Right;  . KNEE ARTHROSCOPY Right 08/03/2017   Procedure: ARTHROSCOPY KNEERIGHT MEDIAL MENISCUS REPAIR;  Surgeon: Leim Fabry, MD;  Location: [facility];   Service: Orthopedics;  Laterality: Right;  MTF CANNULATED DOWELS- WES CONN STRYKER VERSITOMIC REAMERS +/- INJECTABLE MAP3 [place] FRYE ARTHREX DILATORS CORING REAMERS +/- DBM ALEX ROBERTS BIOMET REMOVAL DEVICE (POSSIBLE) (UNKNOWN REP-TRYING TO FIND OUT 70 DEGREE ARTHROSCOPE  . KNEE ARTHROSCOPY WITH ANTERIOR CRUCIATE LIGAMENT (ACL) REPAIR Right   . KNEE SURGERY Left    bone spur  . TONSILLECTOMY    . WISDOM TOOTH EXTRACTION      OB History   None      Home Medications    Prior to Admission medications   Medication Sig Start Date End Date Taking? Authorizing Provider  cyclobenzaprine (FLEXERIL) 10 MG tablet Take 1 tablet (10 mg total) by mouth at bedtime. 06/18/18   Norval Gable, MD  ibuprofen (ADVIL,MOTRIN) 800 MG tablet Take 800 mg by mouth every 8 (eight) hours as needed.    [provider]  meloxicam (MOBIC) 15 MG tablet Take 1 tablet (15 mg total) by mouth daily. 06/18/18   Norval Gable, MD    Family History No family history on file.  Social History Social History   Tobacco Use  . Smoking status: Never Smoker  . Smokeless tobacco: Never Used  Substance Use Topics  . Alcohol use: No  . Drug use: No     Allergies   Percocet [oxycodone-acetaminophen]   Review of Systems Review of Systems  Constitutional: Negative for fever and weight loss.  Cardiovascular: Negative for chest pain.  Gastrointestinal: Negative for abdominal pain and bowel incontinence.  Genitourinary: Negative for bladder incontinence, dysuria  and pelvic pain.  Musculoskeletal: Positive for back pain.  Neurological: Negative for tingling, weakness, numbness, headaches and paresthesias.     Physical Exam Triage Vital Signs ED Triage Vitals  Enc Vitals Group     BP 06/18/18 1509 121/68     Pulse Rate 06/18/18 1509 84     Resp 06/18/18 1509 16     Temp 06/18/18 1509 98.4 F (36.9 C)     Temp Source 06/18/18 1509 Oral     SpO2 06/18/18 1509 98 %     Weight 06/18/18 1508 250 lb  (113.4 kg)     Height 06/18/18 1508 5\' 5"  (1.651 m)     Head Circumference --      Peak Flow --      Pain Score 06/18/18 1508 7     Pain Loc --      Pain Edu? --      Excl. in [place]? --    No data found.  Updated Vital Signs BP 121/68 (BP Location: Left Arm)   Pulse 84   Temp 98.4 F (36.9 C) (Oral)   Resp 16   Ht 5\' 5"  (1.651 m)   Wt 250 lb (113.4 kg)   LMP 05/21/2018 (Approximate)   SpO2 98%   BMI 41.60 kg/m   Visual Acuity Right Eye Distance:   Left Eye Distance:   Bilateral Distance:    Right Eye Near:   Left Eye Near:    Bilateral Near:     Physical Exam  Constitutional: She appears well-developed and well-nourished. No distress.  Musculoskeletal: She exhibits tenderness. She exhibits no edema.       Lumbar back: She exhibits tenderness and spasm. She exhibits normal range of motion, no bony tenderness, no swelling, no edema, no deformity, no laceration, no pain and normal pulse.       Back:  Neurological: She is alert. She has normal reflexes. She displays normal reflexes. She exhibits normal muscle tone.  Skin: Skin is warm and dry. No rash noted. She is not diaphoretic. No erythema.  Nursing note and vitals reviewed.    UC Treatments / Results  Labs (all labs ordered are listed, but only abnormal results are displayed) Labs Reviewed - No data to display  EKG None  Radiology No results found.  Procedures Procedures (including critical care time)  Medications Ordered in UC Medications - No data to display  Initial Impression / Assessment and Plan / UC Course  I have reviewed the triage vital signs and the nursing notes.  Pertinent labs & imaging results that were available during my care of the patient were reviewed by me and considered in my medical decision making (see chart for details).      Final Clinical Impressions(s) / UC Diagnoses   Final diagnoses:  Strain of lumbar region, initial encounter    ED Prescriptions    Medication  Sig Dispense Auth. Provider   cyclobenzaprine (FLEXERIL) 10 MG tablet Take 1 tablet (10 mg total) by mouth at bedtime. 30 tablet Norval Gable, MD   meloxicam (MOBIC) 15 MG tablet Take 1 tablet (15 mg total) by mouth daily. 30 tablet Norval Gable, MD       1.diagnosis reviewed with patient 2. rx as per orders above; reviewed possible side effects, interactions, risks and benefits  3. Recommend supportive treatment with heat 4. Follow-up prn if symptoms worsen or don't improve   Controlled Substance Prescriptions [place] Controlled Substance Registry consulted? Not Applicable   Norval Gable, MD  06/18/18 1542  

## 2018-06-18 NOTE — ED Triage Notes (Signed)
Patient c/o low back pain that started on Thursday. Denies any injury, denies urinary symptoms.

## 2018-10-25 IMAGING — MR MR KNEE*R* W/O CM
7 series · 40 of 40 positions shown · non-contrast
Comparison: None.

CLINICAL DATA: Twisting injury on [REDACTED] complaining of right knee
pain medially with swelling. Unable to bear weight.

EXAM:
MRI OF THE RIGHT KNEE WITHOUT CONTRAST
TECHNIQUE: Multiplanar, multisequence MR imaging of the knee was performed. No
intravenous contrast was administered.

[Series 4: PD fat-sat · sagittal · 3.0mm · 0.50mm/px · 5 of 33 slices shown (1 of 4)]
[im 1/33]
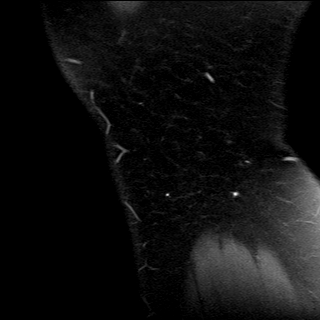
[im 9/33]
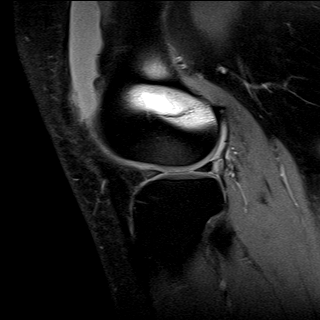
[im 17/33]
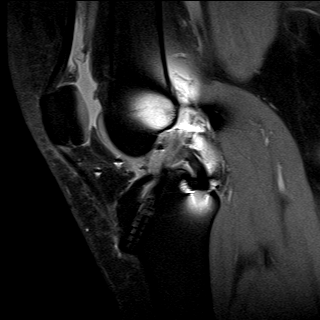
[im 25/33]
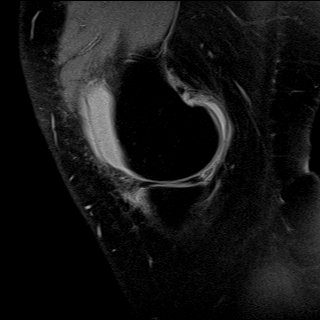
[im 33/33]
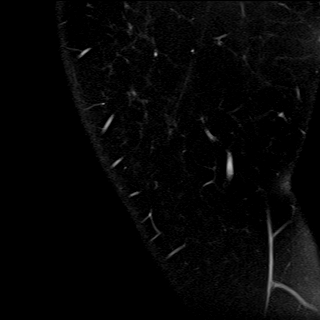

[Series 5: T1 · coronal · 3.0mm · 0.62mm/px · 7 of 45 slices shown]
[im 1/45]
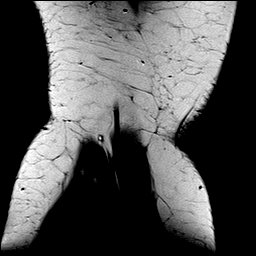
[im 8/45]
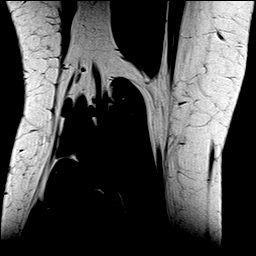
[im 15/45]
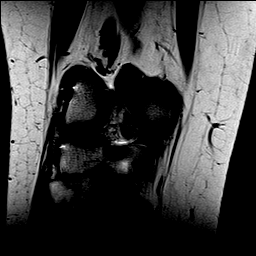
[im 23/45]
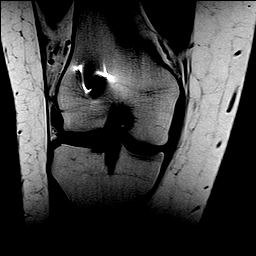
[im 30/45]
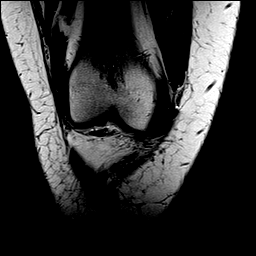
[im 37/45]
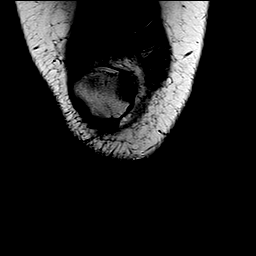
[im 45/45]
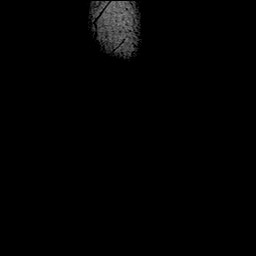

[Series 6: T2 fat-sat · coronal · 3.0mm · 0.31mm/px · 7 of 45 slices shown]
[im 1/45]
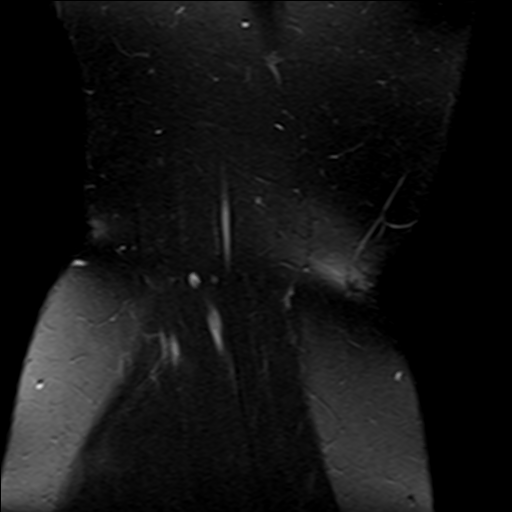
[im 8/45]
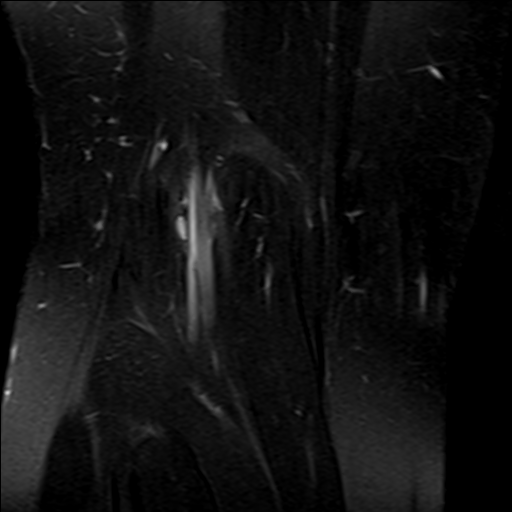
[im 15/45]
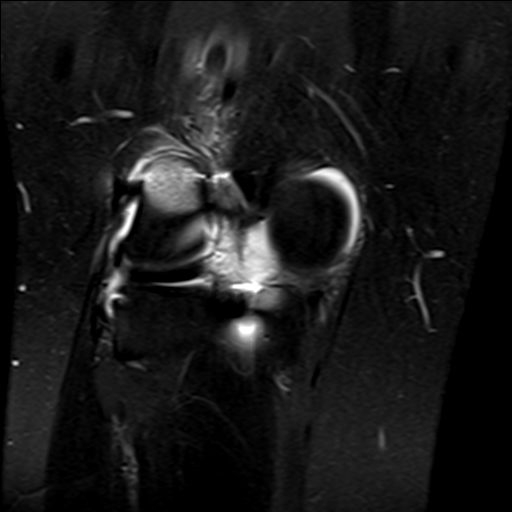
[im 23/45]
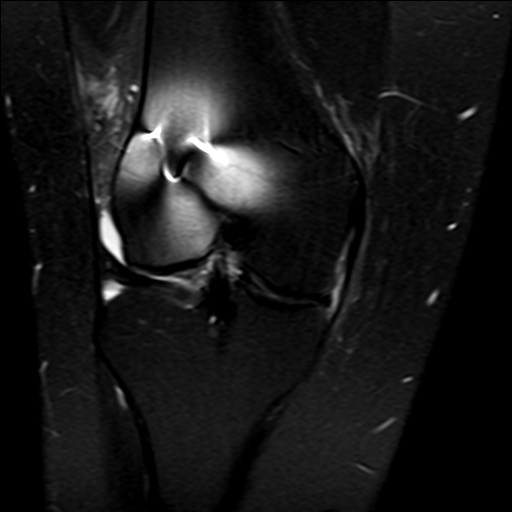
[im 30/45]
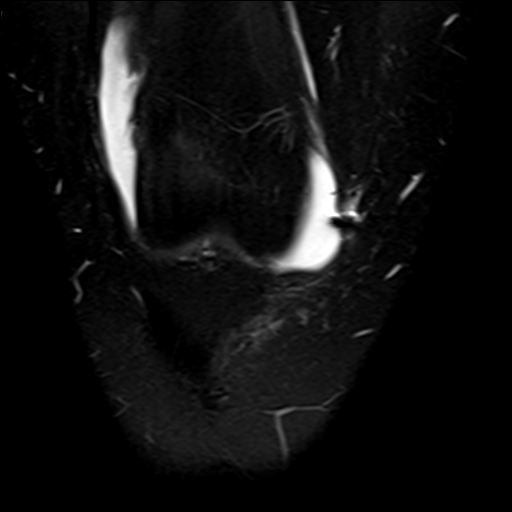
[im 37/45]
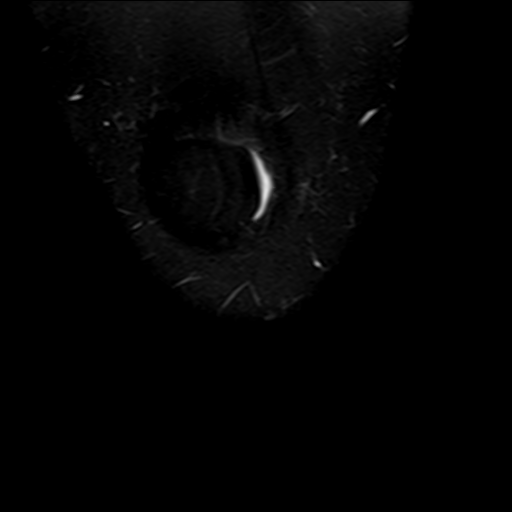
[im 45/45]
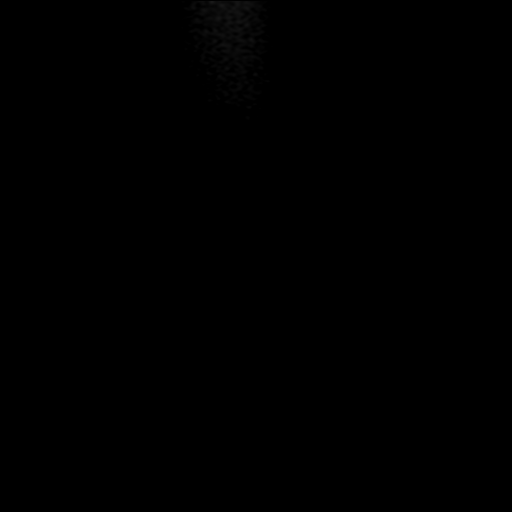

[Series 7: PD fat-sat · coronal · 3.0mm · 0.62mm/px · 7 of 45 slices shown (2 of 4)]
[im 1/45]
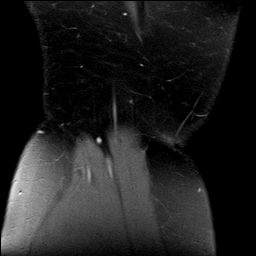
[im 8/45]
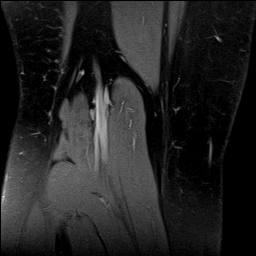
[im 15/45]
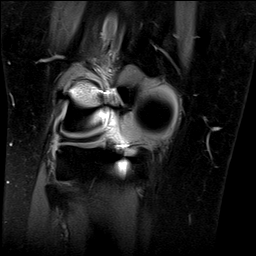
[im 23/45]
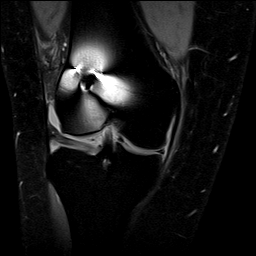
[im 30/45]
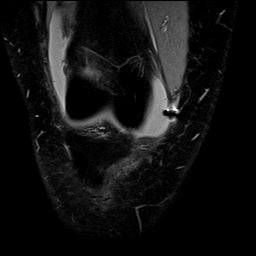
[im 37/45]
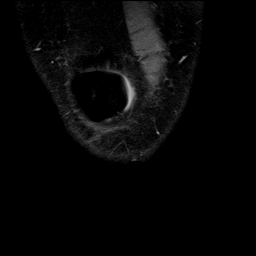
[im 45/45]
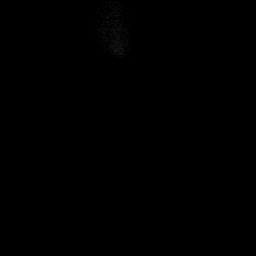

[Series 8: PD fat-sat · axial · 3.0mm · 0.62mm/px · z∈[-80,+49]mm · 6 of 40 slices shown (3 of 4)]
[im 1/40]
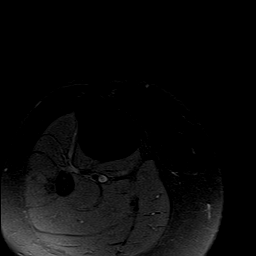
[im 8/40]
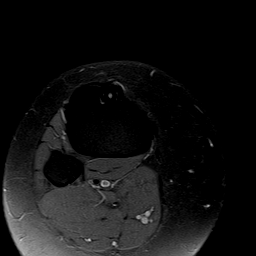
[im 16/40]
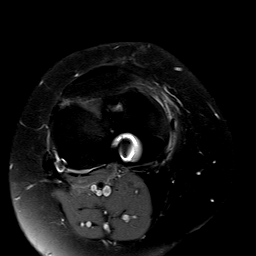
[im 24/40]
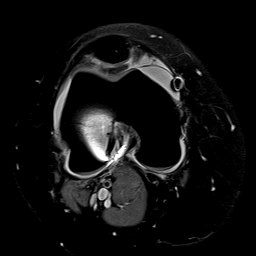
[im 32/40]
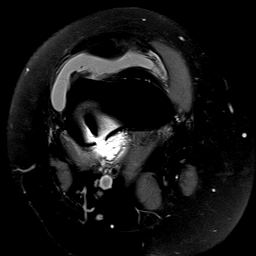
[im 40/40]
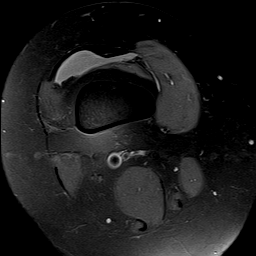

[Series 9: PD fat-sat · coronal · 2.0mm · 0.62mm/px · 3 of 23 slices shown (4 of 4)]
[im 1/23]
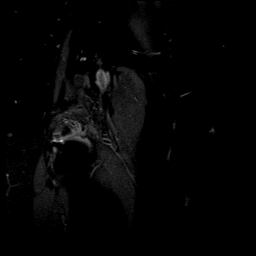
[im 12/23]
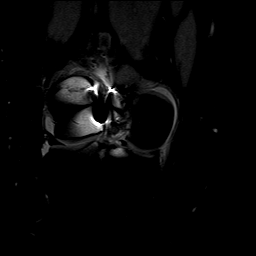
[im 23/23]
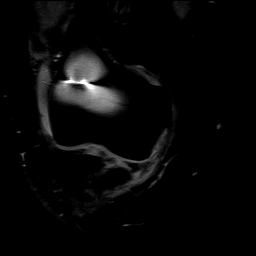

[Series 10: STIR · sagittal · 3.0mm · 0.62mm/px · 5 of 33 slices shown]
[im 1/33]
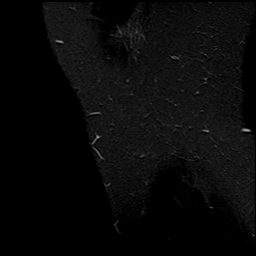
[im 9/33]
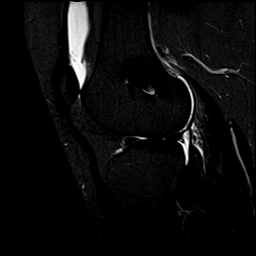
[im 17/33]
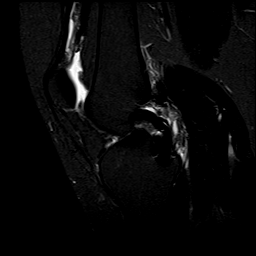
[im 25/33]
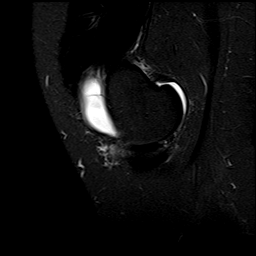
[im 33/33]
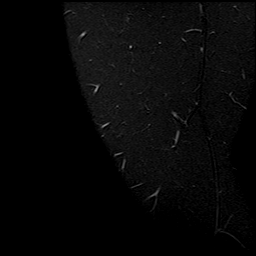

[40 of 40 positions shown; findings below may reference images not displayed]

FINDINGS: MENISCI

Medial meniscus: Small free edge tear extending to the superior and
inferior articular surface involving the white zone of the posterior
horn, series 4, image 23.

Lateral meniscus:  Intact

LIGAMENTS

Cruciates: ACL allograft with mild intrasubstance signal along its
midportion consistent with an ACL sprain, Series 10, image 15.
Intact PCL.

Collaterals: Medial collateral ligament is intact. Lateral
collateral ligament complex is intact.

CARTILAGE

Patellofemoral:  No chondral defect.

Medial: Mild partial-thickness cartilage loss of the medial
femorotibial compartment.

Lateral: Minimal fissuring along the weight-bearing portion of the
lateral femoral condylar cartilage without focal chondral defects.

Joint: Moderate suprapatellar joint effusion with thin medial plica.

Popliteal Fossa:  No Baker cyst. Intact popliteus tendon.

Extensor Mechanism:  Intact quadriceps tendon and patellar tendon.

Bones: No focal marrow signal abnormality. No fracture or
dislocation.

Other: None.
IMPRESSION: 1. Mild intrasubstance signal within the ACL allograft consistent
with an ACL sprain.
2. Small free edge tear of the posterior horn of the medial meniscus
extending to the superior and inferior articular surface.
3. Moderate suprapatellar joint effusion with thin medial
suprapatellar plica.

## 2019-08-29 ENCOUNTER — Other Ambulatory Visit: Payer: Self-pay

## 2019-08-29 DIAGNOSIS — Z20822 Contact with and (suspected) exposure to covid-19: Secondary | ICD-10-CM

## 2019-08-31 LAB — NOVEL CORONAVIRUS, NAA: SARS-CoV-2, NAA: NOT DETECTED

## 2019-10-22 ENCOUNTER — Other Ambulatory Visit: Payer: Self-pay

## 2019-10-22 DIAGNOSIS — Z20822 Contact with and (suspected) exposure to covid-19: Secondary | ICD-10-CM

## 2019-10-24 LAB — NOVEL CORONAVIRUS, NAA: SARS-CoV-2, NAA: NOT DETECTED

## 2021-10-19 ENCOUNTER — Other Ambulatory Visit: Payer: Self-pay

## 2021-10-19 ENCOUNTER — Ambulatory Visit
Admission: RE | Admit: 2021-10-19 | Discharge: 2021-10-19 | Disposition: A | Discharge status: Home / Self Care | Payer: BC Managed Care – PPO | Source: Ambulatory Visit | Attending: Family Medicine | Admitting: Family Medicine

## 2021-10-19 VITALS — BP 112/78 | HR 78 | Temp 98.0°F | Resp 18

## 2021-10-19 DIAGNOSIS — J019 Acute sinusitis, unspecified: Secondary | ICD-10-CM

## 2021-10-19 DIAGNOSIS — R053 Chronic cough: Secondary | ICD-10-CM

## 2021-10-19 DIAGNOSIS — J029 Acute pharyngitis, unspecified: Secondary | ICD-10-CM

## 2021-10-19 MED ORDER — PREDNISONE 20 MG PO TABS: 20.0000 mg | ORAL_TABLET | Freq: Every day | ORAL | 0 refills | Status: AC

## 2021-10-19 MED ORDER — PROMETHAZINE-DM 6.25-15 MG/5ML PO SYRP: 5.0000 mL | ORAL_SOLUTION | Freq: Four times a day (QID) | ORAL | 0 refills | Status: DC | PRN

## 2021-10-19 MED ORDER — AMOXICILLIN-POT CLAVULANATE 875-125 MG PO TABS: 1.0000 | ORAL_TABLET | Freq: Two times a day (BID) | ORAL | 0 refills | Status: DC

## 2021-10-19 NOTE — ED Triage Notes (Signed)
Pt c/o cough, clogged ears, and sore in her throat x 3 weeks

## 2021-10-19 NOTE — ED Provider Notes (Signed)
Roderic Palau    CSN: [id number] Arrival date & time: 10/19/21  1902      History   Chief Complaint Chief Complaint  Patient presents with   Cough    HPI Pamela Munoz is a 25 y.o. female.   HPI Patient presents today with 3 weeks of cough, sore throat and nasal congestion. She is also had some ear discomfort.  Her most worrisome symptom is her sore throat and this morning she noticed blistery lesions in the back of her throat. She is having difficulty speaking and swallowing due to her throat soreness.  Reports no history of recurrent strep.  She is unaware if she has had any fever but does not think she has had a fever.  Denies any associated body aches or headache. Past Medical History:  Diagnosis Date   Morbid obesity with BMI of 40.0-44.9, adult ([place])     There are no problems to display for this patient.   Past Surgical History:  Procedure Laterality Date   ANTERIOR CRUCIATE LIGAMENT REPAIR Right 08/03/2017   Procedure: ANTERIOR CRUCIATE LIGAMENT (ACL) REPAIR;  Surgeon: Leim Fabry, MD;  Location: [facility];  Service: Orthopedics;  Laterality: Right;   KNEE ARTHROSCOPY Right 08/03/2017   Procedure: ARTHROSCOPY KNEERIGHT MEDIAL MENISCUS REPAIR;  Surgeon: Leim Fabry, MD;  Location: [facility];  Service: Orthopedics;  Laterality: Right;  MTF CANNULATED DOWELS- WES CONN STRYKER VERSITOMIC REAMERS +/- INJECTABLE MAP3 [place] FRYE ARTHREX DILATORS CORING REAMERS +/- DBM ALEX ROBERTS BIOMET REMOVAL DEVICE (POSSIBLE) (UNKNOWN REP-TRYING TO FIND OUT 70 DEGREE ARTHROSCOPE   KNEE ARTHROSCOPY WITH ANTERIOR CRUCIATE LIGAMENT (ACL) REPAIR Right    KNEE SURGERY Left    bone spur   TONSILLECTOMY     WISDOM TOOTH EXTRACTION      OB History   No obstetric history on file.      Home Medications    Prior to Admission medications   Medication Sig Start Date End Date Taking? Authorizing Provider  amoxicillin-clavulanate (AUGMENTIN) 875-125 MG tablet  Take 1 tablet by mouth 2 (two) times daily. 10/19/21  Yes Scot Jun, FNP  predniSONE (DELTASONE) 20 MG tablet Take 1 tablet (20 mg total) by mouth daily with breakfast for 5 days. 10/19/21 10/24/21 Yes Scot Jun, FNP  promethazine-dextromethorphan (PROMETHAZINE-DM) 6.25-15 MG/5ML syrup Take 5 mLs by mouth 4 (four) times daily as needed for cough. 10/19/21  Yes Scot Jun, FNP  cyclobenzaprine (FLEXERIL) 10 MG tablet Take 1 tablet (10 mg total) by mouth at bedtime. 06/18/18   Norval Gable, MD  ibuprofen (ADVIL,MOTRIN) 800 MG tablet Take 800 mg by mouth every 8 (eight) hours as needed.    [provider]  meloxicam (MOBIC) 15 MG tablet Take 1 tablet (15 mg total) by mouth daily. 06/18/18   Norval Gable, MD    Family History No family history on file.  Social History Social History   Tobacco Use   Smoking status: Never   Smokeless tobacco: Never  Vaping Use   Vaping Use: Never used  Substance Use Topics   Alcohol use: No   Drug use: No     Allergies   Percocet [oxycodone-acetaminophen]   Review of Systems Review of Systems Pertinent negatives listed in HPI   Physical Exam Triage Vital Signs ED Triage Vitals  Enc Vitals Group     BP 10/19/21 1930 112/78     Pulse Rate 10/19/21 1929 78     Resp --  Temp 10/19/21 1929 98 F (36.7 C)     Temp Source 10/19/21 1929 Oral     SpO2 10/19/21 1929 98 %     Weight --      Height --      Head Circumference --      Peak Flow --      Pain Score --      Pain Loc --      Pain Edu? --      Excl. in [place]? --    No data found.  Updated Vital Signs BP 112/78 (BP Location: Left Arm)   Pulse 78   Temp 98 F (36.7 C) (Oral)   Resp 18   LMP 10/10/2021   SpO2 98%   Visual Acuity Right Eye Distance:   Left Eye Distance:   Bilateral Distance:    Right Eye Near:   Left Eye Near:    Bilateral Near:     Physical Exam General Appearance:    Alert, acutely ill appearing, cooperative, no  distress  HENT:  Normocephalic, TM visible w/o redness, neck has bilateral anterior cervical nodes enlarged, pharynx-swelling, erythematous with  exudate and blisters, and nasal mucosa congested  Eyes:    PERRL, conjunctiva/corneas clear, EOM's intact       Lungs:     Clear to auscultation bilaterally, respirations unlabored  Heart:    Regular rate and rhythm  Neurologic:   Awake, alert, oriented x 3. No apparent focal neurological           defect.         UC Treatments / Results  Labs (all labs ordered are listed, but only abnormal results are displayed) Labs Reviewed - No data to display  EKG   Radiology No results found.  Procedures Procedures (including critical care time)  Medications Ordered in UC Medications - No data to display  Initial Impression / Assessment and Plan / UC Course  I have reviewed the triage vital signs and the nursing notes.  Pertinent labs & imaging results that were available during my care of the patient were reviewed by me and considered in my medical decision making (see chart for details).      Acute pharyngitis and acute sinusitis treatment today with Augmentin twice daily for 10 days.  Persistent cough, prednisone 20 mg x 5 days and as needed Promethazine DM.  Force fluids.  Return as needed. Final Clinical Impressions(s) / UC Diagnoses   Final diagnoses:  Acute pharyngitis, unspecified etiology  Acute non-recurrent sinusitis, unspecified location  Persistent cough for 3 weeks or longer   Discharge Instructions   None    ED Prescriptions     Medication Sig Dispense Auth. Provider   promethazine-dextromethorphan (PROMETHAZINE-DM) 6.25-15 MG/5ML syrup Take 5 mLs by mouth 4 (four) times daily as needed for cough. 140 mL Scot Jun, FNP   amoxicillin-clavulanate (AUGMENTIN) 875-125 MG tablet Take 1 tablet by mouth 2 (two) times daily. 20 tablet Scot Jun, FNP   predniSONE (DELTASONE) 20 MG tablet Take 1 tablet (20 mg  total) by mouth daily with breakfast for 5 days. 5 tablet Scot Jun, FNP      PDMP not reviewed this encounter.   Scot Jun, FNP 10/22/21 1625

## 2021-11-14 NOTE — L&D Delivery Note (Signed)
OB/GYN Faculty Practice Delivery Note  Pamela Munoz is a 26 y.o. G1P0 s/p NSVD at [redacted]w[redacted]d She was admitted for SROM.   ROM: 20h 585mith clear fluid GBS Status:  Negative/-- (12/20 1516) Maximum Maternal Temperature: 98.4 F    Labor Progress: Patient arrived at 1 cm dilation and was induced with foley balloon, misoprostol, and pitocin. She was completely dilated about 20 hours after SROM and delivered about an hour later after a short second stage of labor.   Delivery Date/Time: 11/08/2022 at 0057 Delivery: Called to room and patient was complete and pushing. Head delivered in ROA position. No nuchal cord present. Shoulder and body delivered in usual fashion. Infant with spontaneous cry, placed on mother's abdomen, dried and stimulated. Cord clamped x 2 after 1-minute delay, and cut by FOB. Cord blood drawn. Placenta delivered spontaneously with gentle cord traction. Fundus firm with massage and Pitocin. Labia, perineum, vagina, and cervix inspected with complex stellate 2nd degree laceration with long bilateral labial lacerations as well. There was brisk bleeding from every area making visualization and initial portion of repair extremely difficult. Due to run down the labial lacerations were first repaired with running 2-0 and 3-0 vicryl. The perineal laceration was then repaired with 2-0 and 3-0 vicryl. Due to brisk bleeding and notable ongoing bleeding during repair she was given TXA and 2L LR bolus. At conclusion of repair QBL was 1964 and patient felt light headed, CBC/DIC panel were sent.   Placenta: 3v intact, to L&D Complications: OB hemorrhage Lacerations: 2nd degree EBL: 1964 cc Analgesia: epidural   Infant: Baby boy "Hayden"  APGAR (1 MIN): 9   APGAR (5 MINS): 9    Weight: 3440 grams  MaClarnce FlockMD/MPH Attending Family Medicine Physician, [facility]

## 2022-06-02 ENCOUNTER — Ambulatory Visit (INDEPENDENT_AMBULATORY_CARE_PROVIDER_SITE_OTHER): Payer: BC Managed Care – PPO | Admitting: Obstetrics and Gynecology

## 2022-06-02 ENCOUNTER — Encounter: Payer: Self-pay | Admitting: Obstetrics and Gynecology

## 2022-06-02 ENCOUNTER — Other Ambulatory Visit (HOSPITAL_COMMUNITY)
Admission: RE | Admit: 2022-06-02 | Discharge: 2022-06-02 | Disposition: A | Discharge status: Home / Self Care | Payer: BC Managed Care – PPO | Source: Ambulatory Visit | Attending: Obstetrics and Gynecology | Admitting: Obstetrics and Gynecology

## 2022-06-02 VITALS — BP 137/83 | HR 93 | Wt 278.0 lb

## 2022-06-02 DIAGNOSIS — O99210 Obesity complicating pregnancy, unspecified trimester: Secondary | ICD-10-CM

## 2022-06-02 DIAGNOSIS — Z3490 Encounter for supervision of normal pregnancy, unspecified, unspecified trimester: Secondary | ICD-10-CM | POA: Insufficient documentation

## 2022-06-02 DIAGNOSIS — Z31430 Encounter of female for testing for genetic disease carrier status for procreative management: Secondary | ICD-10-CM

## 2022-06-02 DIAGNOSIS — Z6841 Body Mass Index (BMI) 40.0 and over, adult: Secondary | ICD-10-CM

## 2022-06-02 DIAGNOSIS — Z3481 Encounter for supervision of other normal pregnancy, first trimester: Secondary | ICD-10-CM

## 2022-06-02 DIAGNOSIS — Z3A14 14 weeks gestation of pregnancy: Secondary | ICD-10-CM

## 2022-06-02 MED ORDER — FAMOTIDINE 20 MG PO TABS: 20.0000 mg | ORAL_TABLET | Freq: Two times a day (BID) | ORAL | 3 refills | Status: DC

## 2022-06-02 NOTE — Progress Notes (Signed)
New OB Note  06/02/2022   Clinic: [facility]  Chief Complaint: new OB  Transfer of Care Patient: yes  History of Present Illness: Ms. Pamela Munoz is a 26 y.o. G1P0 @ 14/4 weeks ([place] 1/14, based on Patient's last menstrual period was 02/20/2022.=7wk u/s).  Preg complicated by has Supervision of normal pregnancy, antepartum; BMI 40.0-44.9, adult ([place]); and Obesity in pregnancy on their problem list.   Patient has stable mild to moderate nausea. Currently on b6 and unisom. Pt has some gerd s/s  ROS: A 12-point review of systems was performed and negative, except as stated in the above HPI.  OBGYN History: As per HPI. OB History  Gravida Para Term Preterm AB Living  1            SAB IAB Ectopic Multiple Live Births               # Outcome Date GA Lbr Len/2nd Weight Sex Delivery Anes PTL Lv  1 Current             Any issues with any prior pregnancies: not applicable Prior children are healthy, doing well, and without any problems or issues: not applicable History of pap smears: No.  Past Medical History: Past Medical History:  Diagnosis Date   Morbid obesity with BMI of 40.0-44.9, adult ([place])    Osteochondroma 2011   left leg    Past Surgical History: Past Surgical History:  Procedure Laterality Date   ANTERIOR CRUCIATE LIGAMENT REPAIR Right 08/03/2017   Procedure: ANTERIOR CRUCIATE LIGAMENT (ACL) REPAIR;  Surgeon: Leim Fabry, MD;  Location: [facility];  Service: Orthopedics;  Laterality: Right;   KNEE ARTHROSCOPY Right 08/03/2017   Procedure: ARTHROSCOPY KNEERIGHT MEDIAL MENISCUS REPAIR;  Surgeon: Leim Fabry, MD;  Location: [facility];  Service: Orthopedics;  Laterality: Right;  MTF CANNULATED DOWELS- WES CONN STRYKER VERSITOMIC REAMERS +/- INJECTABLE MAP3 [place] FRYE ARTHREX DILATORS CORING REAMERS +/- DBM ALEX ROBERTS BIOMET REMOVAL DEVICE (POSSIBLE) (UNKNOWN REP-TRYING TO FIND OUT 70 DEGREE ARTHROSCOPE   KNEE ARTHROSCOPY WITH  ANTERIOR CRUCIATE LIGAMENT (ACL) REPAIR Right    KNEE SURGERY Left    bone spur   TONSILLECTOMY     WISDOM TOOTH EXTRACTION      Family History:  History reviewed. No pertinent family history. She denies any history of mental retardation, birth defects or genetic disorders in her or the FOB's history  Social History:  Social History   Socioeconomic History   Marital status: Single    Spouse name: Not on file   Number of children: Not on file   Years of education: Not on file   Highest education level: Not on file  Occupational History   Not on file  Tobacco Use   Smoking status: Never   Smokeless tobacco: Never  Vaping Use   Vaping Use: Never used  Substance and Sexual Activity   Alcohol use: No   Drug use: No   Sexual activity: Yes    Partners: Male    Comment: Pregnant  Other Topics Concern   Not on file  Social History Narrative   ** Merged History Encounter **       Social Determinants of Health   Financial Resource Strain: Not on file  Food Insecurity: Not on file  Transportation Needs: Not on file  Physical Activity: Not on file  Stress: Not on file  Social Connections: Not on file  Intimate Partner Violence: Not on file    Allergy: Allergies  Allergen Reactions   Percocet [Oxycodone-Acetaminophen] Itching    Current Outpatient Medications: Prenatal. Unisom. Vitamin b6  Physical Exam:   BP 137/83   Pulse 93   Wt 278 lb (126.1 kg)   LMP 02/20/2022   BMI 46.26 kg/m  Body mass index is 46.26 kg/m. Contractions: Not present Vag. Bleeding: None. Fundal height: not applicable FHTs: 283T  General appearance: Well nourished, well developed female in no acute distress.  Neck:  Supple, normal appearance, and no thyromegaly  Cardiovascular: S1, S2 normal, no murmur, rub or gallop, regular rate and rhythm Respiratory:  Clear to auscultation bilateral. Normal respiratory effort Abdomen: positive bowel sounds and no masses, hernias; diffusely non  tender to palpation, non distended Breasts: no breast s/s. Neuro/Psych:  Normal mood and affect.  Skin:  Warm and dry.  Lymphatic:  No inguinal lymphadenopathy.   Pelvic exam: is not limited by body habitus EGBUS: within normal limits, Vagina: within normal limits and with no blood in the vault, Cervix: normal appearing cervix without discharge or lesions, closed/long/high, Uterus:  enlarged, c/w 14-16 week size, and Adnexa:  normal adnexa and no mass, fullness, tenderness  Laboratory: none  Imaging:  June 5: SLIUP, CRL consistent 7wks 5days. Normal FHR  Assessment: pt doing well  Plan: 1. Encounter for supervision of normal pregnancy, antepartum, unspecified gravidity Pepcid bid sent in to try and help with the nausea. Offer afp nv - CBC/D/Plt+RPR+Rh+ABO+RubIgG... - Culture, OB Urine - Enroll Patient in PreNatal Babyscripts - Cytology - PAP - Hemoglobin A1c - Comprehensive metabolic panel - Protein / creatinine ratio, urine - [place] MFM OB COMP + 14 WK; Future - TSH Rfx on Abnormal to Free T4  2. Encounter of female for testing for genetic disease carrier status for procreative management - HORIZON CUSTOM  3. Encounter for supervision of other normal pregnancy in first trimester - PANORAMA PRENATAL TEST FULL PANEL  4. [redacted] weeks gestation of pregnancy  5. BMI 40.0-44.9, adult ([facility]) TWG goals d/w pt  6. Obesity in pregnancy  Problem list reviewed and updated.  Follow up in 4 weeks.  >50% of 30 min visit spent on counseling and coordination of care.     Durene Romans MD Attending Center for [facility])

## 2022-06-02 NOTE — Progress Notes (Signed)
NOB   CC: Nausea and vomiting would like to have Rx sent has tried B6 no relief.   Last pap: Never Genetic Screening: Desires and wants to know gender.

## 2022-06-03 ENCOUNTER — Encounter: Payer: Self-pay | Admitting: Obstetrics and Gynecology

## 2022-06-03 LAB — CBC/D/PLT+RPR+RH+ABO+RUBIGG...
Antibody Screen: NEGATIVE
Basophils Absolute: 0 10*3/uL (ref 0.0–0.2)
Basos: 0 %
EOS (ABSOLUTE): 0.1 10*3/uL (ref 0.0–0.4)
Eos: 1 %
HCV Ab: NONREACTIVE
HIV Screen 4th Generation wRfx: NONREACTIVE
Hematocrit: 39.1 % (ref 34.0–46.6)
Hemoglobin: 12.8 g/dL (ref 11.1–15.9)
Hepatitis B Surface Ag: NEGATIVE
Immature Grans (Abs): 0 10*3/uL (ref 0.0–0.1)
Immature Granulocytes: 0 %
Lymphocytes Absolute: 2 10*3/uL (ref 0.7–3.1)
Lymphs: 26 %
MCH: 27.2 pg (ref 26.6–33.0)
MCHC: 32.7 g/dL (ref 31.5–35.7)
MCV: 83 fL (ref 79–97)
Monocytes Absolute: 0.6 10*3/uL (ref 0.1–0.9)
Monocytes: 8 %
Neutrophils Absolute: 5 10*3/uL (ref 1.4–7.0)
Neutrophils: 65 %
Platelets: 230 10*3/uL (ref 150–450)
RBC: 4.7 x10E6/uL (ref 3.77–5.28)
RDW: 14 % (ref 11.7–15.4)
RPR Ser Ql: NONREACTIVE
Rh Factor: POSITIVE
Rubella Antibodies, IGG: 1.44 index (ref >0.99)
WBC: 7.7 10*3/uL (ref 3.4–10.8)

## 2022-06-03 LAB — COMPREHENSIVE METABOLIC PANEL
ALT: 23 IU/L (ref 0–32)
AST: 14 IU/L (ref 0–40)
Albumin/Globulin Ratio: 1.5 (ref 1.2–2.2)
Albumin: 4 g/dL (ref 4.0–5.0)
Alkaline Phosphatase: 67 IU/L (ref 44–121)
BUN/Creatinine Ratio: 13 (ref 9–23)
BUN: 6 mg/dL (ref 6–20)
Bilirubin Total: 0.3 mg/dL (ref 0.0–1.2)
CO2: 21 mmol/L (ref 20–29)
Calcium: 9.1 mg/dL (ref 8.7–10.2)
Chloride: 102 mmol/L (ref 96–106)
Creatinine, Ser: 0.47 mg/dL — ABNORMAL LOW (ref 0.57–1.00)
Globulin, Total: 2.6 g/dL (ref 1.5–4.5)
Glucose: 87 mg/dL (ref 70–99)
Potassium: 4 mmol/L (ref 3.5–5.2)
Sodium: 136 mmol/L (ref 134–144)
Total Protein: 6.6 g/dL (ref 6.0–8.5)
eGFR: 136 mL/min/{1.73_m2} (ref >59)

## 2022-06-03 LAB — PROTEIN / CREATININE RATIO, URINE
Creatinine, Urine: 107.2 mg/dL
Protein, Ur: 8.1 mg/dL
Protein/Creat Ratio: 76 mg/g creat (ref 0–200)

## 2022-06-03 LAB — HCV INTERPRETATION

## 2022-06-03 LAB — TSH RFX ON ABNORMAL TO FREE T4: TSH: 0.536 u[IU]/mL (ref 0.450–4.500)

## 2022-06-03 LAB — HEMOGLOBIN A1C
Est. average glucose Bld gHb Est-mCnc: 111 mg/dL
Hgb A1c MFr Bld: 5.5 % (ref 4.8–5.6)

## 2022-06-04 LAB — CULTURE, OB URINE

## 2022-06-04 LAB — URINE CULTURE, OB REFLEX

## 2022-06-06 LAB — CYTOLOGY - PAP
Chlamydia: NEGATIVE
Comment: NEGATIVE
Comment: NORMAL
Diagnosis: NEGATIVE
Neisseria Gonorrhea: NEGATIVE

## 2022-06-09 LAB — PANORAMA PRENATAL TEST FULL PANEL:PANORAMA TEST PLUS 5 ADDITIONAL MICRODELETIONS: FETAL FRACTION: 4.4

## 2022-06-09 LAB — HORIZON CUSTOM: REPORT SUMMARY: NEGATIVE

## 2022-07-07 ENCOUNTER — Ambulatory Visit: Payer: BC Managed Care – PPO | Attending: Obstetrics

## 2022-07-07 ENCOUNTER — Ambulatory Visit (HOSPITAL_BASED_OUTPATIENT_CLINIC_OR_DEPARTMENT_OTHER): Payer: BC Managed Care – PPO | Admitting: Obstetrics

## 2022-07-07 ENCOUNTER — Ambulatory Visit (INDEPENDENT_AMBULATORY_CARE_PROVIDER_SITE_OTHER): Payer: BC Managed Care – PPO | Admitting: Advanced Practice Midwife

## 2022-07-07 ENCOUNTER — Other Ambulatory Visit: Payer: Self-pay

## 2022-07-07 ENCOUNTER — Encounter: Payer: Self-pay | Admitting: Obstetrics and Gynecology

## 2022-07-07 VITALS — BP 130/79 | HR 112 | Wt 279.0 lb

## 2022-07-07 DIAGNOSIS — E669 Obesity, unspecified: Secondary | ICD-10-CM | POA: Diagnosis not present

## 2022-07-07 DIAGNOSIS — Z3490 Encounter for supervision of normal pregnancy, unspecified, unspecified trimester: Secondary | ICD-10-CM

## 2022-07-07 DIAGNOSIS — O99212 Obesity complicating pregnancy, second trimester: Secondary | ICD-10-CM | POA: Diagnosis not present

## 2022-07-07 DIAGNOSIS — O43193 Other malformation of placenta, third trimester: Secondary | ICD-10-CM | POA: Insufficient documentation

## 2022-07-07 DIAGNOSIS — Z3A19 19 weeks gestation of pregnancy: Secondary | ICD-10-CM | POA: Diagnosis not present

## 2022-07-07 DIAGNOSIS — O43192 Other malformation of placenta, second trimester: Secondary | ICD-10-CM

## 2022-07-07 MED ORDER — LORATADINE 10 MG PO TABS: 10.0000 mg | ORAL_TABLET | Freq: Every day | ORAL | 0 refills | Status: DC

## 2022-07-07 MED ORDER — ASPIRIN 81 MG PO TBEC: 81.0000 mg | DELAYED_RELEASE_TABLET | Freq: Every day | ORAL | 12 refills | Status: DC

## 2022-07-07 NOTE — Progress Notes (Signed)
MFM Note  Pamela Munoz was seen for a detailed fetal anatomy and consultation scan due to maternal obesity with a BMI of 45.  She denies any significant past medical history and denies any problems in her current pregnancy.    She had a cell free DNA test earlier in her pregnancy which indicated a low risk for trisomy 2, 47, and 13. A female fetus is predicted.   She was informed that the fetal growth and amniotic fluid level were appropriate for her gestational age.   There were no obvious fetal anomalies noted on today's ultrasound exam.  However, today's exam was limited due to the fetal position.  The patient was informed that anomalies may be missed due to technical limitations. If the fetus is in a suboptimal position or maternal habitus is increased, visualization of the fetus in the maternal uterus may be impaired.  The following were discussed during today's consultation:  Obesity in pregnancy  The recommended total weight gain in pregnancy for obese women's between 10 to 20 pounds.  Due to obesity, she should be screened for gestational diabetes at between 7 to 28 weeks.    We will continue to follow her with monthly growth ultrasounds.    Due to the increased risk of stillbirth in obese women with a BMI of greater than 40, weekly antenatal fetal surveillance should be started at 34 weeks.  As maternal obesity may present challenges associated with the management of anesthesia, an anesthesia consult should be obtained when she is admitted in labor.  A follow-up exam was scheduled in 4 weeks to assess the fetal growth and to complete the views of the fetal anatomy.    The patient stated that all of her questions were answered today.  A total of 30 minutes was spent counseling and coordinating the care for this patient.  Greater than 50% of the time was spent in direct face-to-face contact.

## 2022-07-07 NOTE — Patient Instructions (Addendum)
Childbirth and Newborn Classes:  [facility]       Safe Medications in Pregnancy   Acne: Benzoyl Peroxide Salicylic Acid  Backache/Headache: Tylenol: 2 regular strength every 4 hours OR              2 Extra strength every 6 hours  Colds/Coughs/Allergies: Benadryl (alcohol free) 25 mg every 6 hours as needed Breath right strips Claritin Cepacol throat lozenges Chloraseptic throat spray Cold-Eeze- up to three times per day Cough drops, alcohol free Flonase (by prescription only) Guaifenesin Mucinex Robitussin DM (plain only, alcohol free) Saline nasal spray/drops Sudafed (pseudoephedrine) & Actifed ** use only after [redacted] weeks gestation and if you do not have high blood pressure Tylenol Vicks Vaporub Zinc lozenges Zyrtec   Constipation: Colace Ducolax suppositories Fleet enema Glycerin suppositories Metamucil Milk of magnesia Miralax Senokot Smooth move tea  Diarrhea: Kaopectate Imodium A-D  *NO pepto Bismol  Hemorrhoids: Anusol Anusol HC Preparation H Tucks  Indigestion: Tums Maalox Mylanta Zantac  Pepcid  Insomnia: Benadryl (alcohol free) '25mg'$  every 6 hours as needed Tylenol PM Unisom, no Gelcaps  Leg Cramps: Tums MagGel  Nausea/Vomiting:  Bonine Dramamine Emetrol Ginger extract Sea bands Meclizine  Nausea medication to take during pregnancy:  Unisom (doxylamine succinate 25 mg tablets) Take one tablet daily at bedtime. If symptoms are not adequately controlled, the dose can be increased to a maximum recommended dose of two tablets daily (1/2 tablet in the morning, 1/2 tablet mid-afternoon and one at bedtime). Vitamin B6 '100mg'$  tablets. Take one tablet twice a day (up to 200 mg per day).  Skin Rashes: Aveeno products Benadryl cream or '25mg'$  every 6 hours as needed Calamine Lotion 1% cortisone cream  Yeast infection: Gyne-lotrimin 7 Monistat 7   **If taking multiple medications, please check labels to avoid duplicating  the same active ingredients **take medication as directed on the label ** Do not exceed 4000 mg of tylenol in 24 hours **Do not take medications that contain aspirin or ibuprofen

## 2022-07-07 NOTE — Progress Notes (Signed)
ROB [redacted]w[redacted]d CC: None

## 2022-07-07 NOTE — Progress Notes (Signed)
   PRENATAL VISIT NOTE  Subjective:  Pamela Munoz is a 26 y.o. G1P0 at 79w4dbeing seen today for ongoing prenatal care.  She is currently monitored for the following issues for this low-risk pregnancy and has Supervision of normal pregnancy, antepartum; BMI 40.0-44.9, adult ([place]; Obesity in pregnancy; and Marginal insertion of umbilical cord affecting management of mother in second trimester on their problem list.  Patient reports no complaints.  Contractions: Not present. Vag. Bleeding: None.  Movement: Present. Denies leaking of fluid.   The following portions of the patient's history were reviewed and updated as appropriate: allergies, current medications, past family history, past medical history, past social history, past surgical history and problem list. Problem list updated.  Objective:   Vitals:   07/07/22 1420  BP: 130/79  Pulse: (!) 112  Weight: 279 lb (126.6 kg)    Fetal Status: Fetal Heart Rate (bpm): 152   Movement: Present     General:  Alert, oriented and cooperative. Patient is in no acute distress.  Skin: Skin is warm and dry. No rash noted.   Cardiovascular: Normal heart rate noted  Respiratory: Normal respiratory effort, no problems with respiration noted  Abdomen: Soft, gravid, appropriate for gestational age.  Pain/Pressure: Absent     Pelvic: Cervical exam deferred        Extremities: Normal range of motion.  Edema: Trace  Mental Status: Normal mood and affect. Normal behavior. Normal judgment and thought content.   Assessment and Plan:  Pregnancy: G1P0 at 15w4d1. Encounter for supervision of normal pregnancy, antepartum, unspecified gravidity - HAPPY BIRTHDAY!!! - Doing so well - AFP declined today  2. Marginal insertion of umbilical cord affecting management of mother in second trimester - Documented on today's anatomy scan, results reviewed - Discussed this is typical indicator for growth scans, already part of care plan  3. [redacted] weeks gestation  of pregnancy   Preterm labor symptoms and general obstetric precautions including but not limited to vaginal bleeding, contractions, leaking of fluid and fetal movement were reviewed in detail with the patient. Please refer to After Visit Summary for other counseling recommendations.  Return in about 4 weeks (around 08/04/2022).  Future Appointments  Date Time Provider DeKaleva9/21/2023  2:00 PM [facility]/21[facility]2023  3:50 PM WeDarlina RumpfCNM [facility]  08/31/2022  8:30 AM [facility] LAB [facility]  08/31/2022  9:35 AM PrDonnamae JudeMD [facility]  09/21/2022  2:30 PM PrDonnamae JudeMD [facility]  10/05/2022  2:30 PM PrDonnamae JudeMD [facility]    SaDarlina RumpfCNM

## 2022-07-14 ENCOUNTER — Ambulatory Visit: Payer: BC Managed Care – PPO

## 2022-08-02 ENCOUNTER — Other Ambulatory Visit: Payer: Self-pay

## 2022-08-02 DIAGNOSIS — O99212 Obesity complicating pregnancy, second trimester: Secondary | ICD-10-CM

## 2022-08-04 ENCOUNTER — Other Ambulatory Visit: Payer: Self-pay

## 2022-08-04 ENCOUNTER — Ambulatory Visit (INDEPENDENT_AMBULATORY_CARE_PROVIDER_SITE_OTHER): Payer: BC Managed Care – PPO | Admitting: Advanced Practice Midwife

## 2022-08-04 ENCOUNTER — Ambulatory Visit: Payer: BC Managed Care – PPO | Attending: Obstetrics

## 2022-08-04 VITALS — BP 138/81 | HR 84 | Wt 285.0 lb

## 2022-08-04 DIAGNOSIS — Z6841 Body Mass Index (BMI) 40.0 and over, adult: Secondary | ICD-10-CM

## 2022-08-04 DIAGNOSIS — Z3A23 23 weeks gestation of pregnancy: Secondary | ICD-10-CM

## 2022-08-04 DIAGNOSIS — E669 Obesity, unspecified: Secondary | ICD-10-CM

## 2022-08-04 DIAGNOSIS — O99212 Obesity complicating pregnancy, second trimester: Secondary | ICD-10-CM | POA: Diagnosis not present

## 2022-08-04 DIAGNOSIS — O43192 Other malformation of placenta, second trimester: Secondary | ICD-10-CM

## 2022-08-04 DIAGNOSIS — Z3402 Encounter for supervision of normal first pregnancy, second trimester: Secondary | ICD-10-CM

## 2022-08-04 NOTE — Progress Notes (Signed)
   PRENATAL VISIT NOTE  Subjective:  Pamela Munoz is a 26 y.o. G1P0 at 56w4dbeing seen today for ongoing prenatal care.  She is currently monitored for the following issues for this low-risk pregnancy and has Supervision of normal pregnancy, antepartum; BMI 40.0-44.9, adult ([place]; Obesity in pregnancy; and Marginal insertion of umbilical cord affecting management of mother in second trimester on their problem list.  Patient reports no complaints.  Contractions: Not present. Vag. Bleeding: None.  Movement: Present. Denies leaking of fluid.   The following portions of the patient's history were reviewed and updated as appropriate: allergies, current medications, past family history, past medical history, past social history, past surgical history and problem list. Problem list updated.  Objective:   Vitals:   08/04/22 1545  BP: 138/81  Pulse: 84  Weight: 285 lb (129.3 kg)    Fetal Status:     Movement: Present     General:  Alert, oriented and cooperative. Patient is in no acute distress.  Skin: Skin is warm and dry. No rash noted.   Cardiovascular: Normal heart rate noted  Respiratory: Normal respiratory effort, no problems with respiration noted  Abdomen: Soft, gravid, appropriate for gestational age.  Pain/Pressure: Absent     Pelvic: Cervical exam deferred        Extremities: Normal range of motion.     Mental Status: Normal mood and affect. Normal behavior. Normal judgment and thought content.   Assessment and Plan:  Pregnancy: G1P0 at 210w4d1. Encounter for supervision of normal first pregnancy in second trimester - Thriving in second trimester   2. BMI 40.0-44.9, adult ([facility] Reviewed indication for serial growth scans with MFM - Goal is overall management is risk factors, not stigmatizing patient - S/p normal hgbA1C at [facility]  Preterm labor symptoms and general obstetric precautions including but not limited to vaginal bleeding, contractions, leaking of fluid and  fetal movement were reviewed in detail with the patient. Please refer to After Visit Summary for other counseling recommendations.  Return in about 4 weeks (around 09/01/2022).  Future Appointments  Date Time Provider DeParkin10/18/2023  8:30 AM [facility]  08/31/2022  9:35 AM PrDonnamae JudeMD [facility]  09/08/2022  2:00 PM [facility]  09/21/2022  2:30 PM PrDonnamae JudeMD [facility]  10/05/2022  2:30 PM PrDonnamae JudeMD [facility]    SaDarlina RumpfCNM

## 2022-08-31 ENCOUNTER — Encounter: Payer: BC Managed Care – PPO | Admitting: Family Medicine

## 2022-08-31 ENCOUNTER — Other Ambulatory Visit: Payer: BC Managed Care – PPO

## 2022-08-31 ENCOUNTER — Ambulatory Visit (INDEPENDENT_AMBULATORY_CARE_PROVIDER_SITE_OTHER): Payer: BC Managed Care – PPO | Admitting: Family Medicine

## 2022-08-31 ENCOUNTER — Encounter: Payer: Self-pay | Admitting: Family Medicine

## 2022-08-31 VITALS — BP 130/84 | HR 96 | Wt 286.0 lb

## 2022-08-31 DIAGNOSIS — Z3490 Encounter for supervision of normal pregnancy, unspecified, unspecified trimester: Secondary | ICD-10-CM

## 2022-08-31 DIAGNOSIS — Z3A27 27 weeks gestation of pregnancy: Secondary | ICD-10-CM

## 2022-08-31 DIAGNOSIS — Z3402 Encounter for supervision of normal first pregnancy, second trimester: Secondary | ICD-10-CM

## 2022-08-31 DIAGNOSIS — O99212 Obesity complicating pregnancy, second trimester: Secondary | ICD-10-CM

## 2022-08-31 DIAGNOSIS — O99210 Obesity complicating pregnancy, unspecified trimester: Secondary | ICD-10-CM

## 2022-08-31 DIAGNOSIS — Z3480 Encounter for supervision of other normal pregnancy, unspecified trimester: Secondary | ICD-10-CM

## 2022-08-31 DIAGNOSIS — Z3492 Encounter for supervision of normal pregnancy, unspecified, second trimester: Secondary | ICD-10-CM

## 2022-08-31 NOTE — Progress Notes (Signed)
ROB [redacted]w[redacted]d GTT today  T-dap offered: Declined  Flu Vaccine : Negative

## 2022-08-31 NOTE — Patient Instructions (Signed)
[email or web address]

## 2022-08-31 NOTE — Progress Notes (Signed)
   PRENATAL VISIT NOTE  Subjective:  Pamela Munoz is a 26 y.o. G1P0 at 11w3dbeing seen today for ongoing prenatal care.  She is currently monitored for the following issues for this low-risk pregnancy and has Supervision of normal pregnancy, antepartum; BMI 40.0-44.9, adult ([place]; Obesity in pregnancy; and Marginal insertion of umbilical cord affecting management of mother in second trimester on their problem list.  Patient reports no complaints.  Contractions: Not present. Vag. Bleeding: None.  Movement: Present. Denies leaking of fluid.   The following portions of the patient's history were reviewed and updated as appropriate: allergies, current medications, past family history, past medical history, past social history, past surgical history and problem list.   Objective:   Vitals:   08/31/22 0947  BP: 130/84  Pulse: 96  Weight: 286 lb (129.7 kg)    Fetal Status: Fetal Heart Rate (bpm): 140   Movement: Present     General:  Alert, oriented and cooperative. Patient is in no acute distress.  Skin: Skin is warm and dry. No rash noted.   Cardiovascular: Normal heart rate noted  Respiratory: Normal respiratory effort, no problems with respiration noted  Abdomen: Soft, gravid, appropriate for gestational age.  Pain/Pressure: Absent     Pelvic: Cervical exam deferred        Extremities: Normal range of motion.  Edema: Trace  Mental Status: Normal mood and affect. Normal behavior. Normal judgment and thought content.   Assessment and Plan:  Pregnancy: G1P0 at 225w3d. Encounter for supervision of normal pregnancy, antepartum, unspecified gravidity 28 wk labs today RH pos Wants TDaP next visit Declines flu  2. Obesity in pregnancy F/u u/s scheduled  Preterm labor symptoms and general obstetric precautions including but not limited to vaginal bleeding, contractions, leaking of fluid and fetal movement were reviewed in detail with the patient. Please refer to After Visit Summary  for other counseling recommendations.   Return in 2 weeks (on 09/14/2022).  Future Appointments  Date Time Provider DeLonaconing10/26/2023  2:00 PM [facility]11/06/2022  2:30 PM PrDonnamae JudeMD [facility]  10/05/2022  2:30 PM PrDonnamae JudeMD [facility]  10/20/2022  2:30 PM Anyanwu, UgSallyanne HaversMD [facility]  11/02/2022  2:30 PM PiAletha HalimMD [facility]  11/10/2022  2:30 PM WeDarlina RumpfCNM [facility]    TaDonnamae JudeMD

## 2022-09-01 LAB — CBC
Hematocrit: 37.6 % (ref 34.0–46.6)
Hemoglobin: 12.2 g/dL (ref 11.1–15.9)
MCH: 27.7 pg (ref 26.6–33.0)
MCHC: 32.4 g/dL (ref 31.5–35.7)
MCV: 86 fL (ref 79–97)
Platelets: 201 10*3/uL (ref 150–450)
RBC: 4.4 x10E6/uL (ref 3.77–5.28)
RDW: 14.2 % (ref 11.7–15.4)
WBC: 7.4 10*3/uL (ref 3.4–10.8)

## 2022-09-01 LAB — GLUCOSE TOLERANCE, 2 HOURS W/ 1HR
Glucose, 1 hour: 131 mg/dL (ref 70–179)
Glucose, 2 hour: 97 mg/dL (ref 70–152)
Glucose, Fasting: 85 mg/dL (ref 70–91)

## 2022-09-01 LAB — HIV ANTIBODY (ROUTINE TESTING W REFLEX): HIV Screen 4th Generation wRfx: NONREACTIVE

## 2022-09-01 LAB — RPR: RPR Ser Ql: NONREACTIVE

## 2022-09-06 ENCOUNTER — Other Ambulatory Visit: Payer: Self-pay

## 2022-09-06 DIAGNOSIS — O99212 Obesity complicating pregnancy, second trimester: Secondary | ICD-10-CM

## 2022-09-08 ENCOUNTER — Other Ambulatory Visit: Payer: Self-pay

## 2022-09-08 ENCOUNTER — Ambulatory Visit: Payer: BC Managed Care – PPO | Attending: Obstetrics and Gynecology

## 2022-09-08 DIAGNOSIS — O24113 Pre-existing diabetes mellitus, type 2, in pregnancy, third trimester: Secondary | ICD-10-CM | POA: Diagnosis not present

## 2022-09-08 DIAGNOSIS — O43193 Other malformation of placenta, third trimester: Secondary | ICD-10-CM | POA: Diagnosis not present

## 2022-09-08 DIAGNOSIS — E669 Obesity, unspecified: Secondary | ICD-10-CM | POA: Insufficient documentation

## 2022-09-08 DIAGNOSIS — Z3A28 28 weeks gestation of pregnancy: Secondary | ICD-10-CM | POA: Insufficient documentation

## 2022-09-08 DIAGNOSIS — O99213 Obesity complicating pregnancy, third trimester: Secondary | ICD-10-CM | POA: Diagnosis not present

## 2022-09-08 DIAGNOSIS — O99212 Obesity complicating pregnancy, second trimester: Secondary | ICD-10-CM

## 2022-09-21 ENCOUNTER — Ambulatory Visit (INDEPENDENT_AMBULATORY_CARE_PROVIDER_SITE_OTHER): Payer: BC Managed Care – PPO | Admitting: Family Medicine

## 2022-09-21 ENCOUNTER — Encounter: Payer: Self-pay | Admitting: Family Medicine

## 2022-09-21 VITALS — BP 134/84 | HR 98 | Wt 290.0 lb

## 2022-09-21 DIAGNOSIS — Z3A30 30 weeks gestation of pregnancy: Secondary | ICD-10-CM

## 2022-09-21 DIAGNOSIS — Z3403 Encounter for supervision of normal first pregnancy, third trimester: Secondary | ICD-10-CM

## 2022-09-21 DIAGNOSIS — Z3400 Encounter for supervision of normal first pregnancy, unspecified trimester: Secondary | ICD-10-CM

## 2022-09-21 DIAGNOSIS — O43193 Other malformation of placenta, third trimester: Secondary | ICD-10-CM

## 2022-09-21 DIAGNOSIS — O43192 Other malformation of placenta, second trimester: Secondary | ICD-10-CM

## 2022-09-21 NOTE — Progress Notes (Signed)
ROB [redacted]w[redacted]d  Pt would like to discuss having Midwife do delivery.

## 2022-09-21 NOTE — Progress Notes (Signed)
   PRENATAL VISIT NOTE  Subjective:  Pamela Munoz is a 26 y.o. G1P0 at 20w3dbeing seen today for ongoing prenatal care.  She is currently monitored for the following issues for this low-risk pregnancy and has Supervision of normal pregnancy, antepartum; BMI 40.0-44.9, adult ([place]; Obesity in pregnancy; and Marginal insertion of umbilical cord affecting management of mother in second trimester on their problem list.  Patient reports no complaints.  Contractions: Not present. Vag. Bleeding: None.  Movement: Present. Denies leaking of fluid.   The following portions of the patient's history were reviewed and updated as appropriate: allergies, current medications, past family history, past medical history, past social history, past surgical history and problem list.   Objective:   Vitals:   09/21/22 1435  BP: 134/84  Pulse: 98  Weight: 290 lb (131.5 kg)    Fetal Status: Fetal Heart Rate (bpm): 140 Fundal Height: 33 cm Movement: Present     General:  Alert, oriented and cooperative. Patient is in no acute distress.  Skin: Skin is warm and dry. No rash noted.   Cardiovascular: Normal heart rate noted  Respiratory: Normal respiratory effort, no problems with respiration noted  Abdomen: Soft, gravid, appropriate for gestational age.  Pain/Pressure: Present     Pelvic: Cervical exam deferred        Extremities: Normal range of motion.  Edema: Trace  Mental Status: Normal mood and affect. Normal behavior. Normal judgment and thought content.   Assessment and Plan:  Pregnancy: G1P0 at 373w3d. Marginal insertion of umbilical cord affecting management of mother in second trimester No evidence on last u/s Last growth at 97%  2. Supervision of normal first pregnancy, antepartum Passed her sugar testing. Has ha dher TDaP  Preterm labor symptoms and general obstetric precautions including but not limited to vaginal bleeding, contractions, leaking of fluid and fetal movement were reviewed in  detail with the patient. Please refer to After Visit Summary for other counseling recommendations.   Return in 2 weeks (on 10/05/2022).  Future Appointments  Date Time Provider DeFleischmanns11/22/2023  2:30 PM PrDonnamae JudeMD [facility]  10/20/2022  1:00 PM [facility]  10/20/2022  2:30 PM Anyanwu, UgSallyanne HaversMD [facility]  10/27/2022  2:00 PM [facility] US1 [facility] MFVincent12/20/2023  2:30 PM PiAletha HalimMD [facility]  11/03/2022  2:00 PM [facility]  11/10/2022  2:30 PM WeDarlina RumpfCNM [facility]    TaDonnamae JudeMD

## 2022-10-03 ENCOUNTER — Ambulatory Visit (INDEPENDENT_AMBULATORY_CARE_PROVIDER_SITE_OTHER): Payer: BC Managed Care – PPO | Admitting: Family Medicine

## 2022-10-03 VITALS — BP 130/84 | HR 99 | Wt 293.0 lb

## 2022-10-03 DIAGNOSIS — O43192 Other malformation of placenta, second trimester: Secondary | ICD-10-CM

## 2022-10-03 DIAGNOSIS — Z3A32 32 weeks gestation of pregnancy: Secondary | ICD-10-CM

## 2022-10-03 DIAGNOSIS — O99210 Obesity complicating pregnancy, unspecified trimester: Secondary | ICD-10-CM

## 2022-10-03 DIAGNOSIS — O99213 Obesity complicating pregnancy, third trimester: Secondary | ICD-10-CM

## 2022-10-03 DIAGNOSIS — Z3400 Encounter for supervision of normal first pregnancy, unspecified trimester: Secondary | ICD-10-CM

## 2022-10-03 DIAGNOSIS — O43193 Other malformation of placenta, third trimester: Secondary | ICD-10-CM

## 2022-10-03 DIAGNOSIS — Z3403 Encounter for supervision of normal first pregnancy, third trimester: Secondary | ICD-10-CM

## 2022-10-03 NOTE — Progress Notes (Signed)
   PRENATAL VISIT NOTE  Subjective:  Pamela Munoz is a 26 y.o. G1P0 at 47w1dbeing seen today for ongoing prenatal care.  She is currently monitored for the following issues for this low-risk pregnancy and has Supervision of normal pregnancy, antepartum; BMI 40.0-44.9, adult ([place]; Obesity in pregnancy; and Marginal insertion of umbilical cord affecting management of mother in second trimester on their problem list.  Patient reports no complaints.  Contractions: Irritability. Vag. Bleeding: None.  Movement: Present. Denies leaking of fluid.   The following portions of the patient's history were reviewed and updated as appropriate: allergies, current medications, past family history, past medical history, past social history, past surgical history and problem list.   Objective:   Vitals:   10/03/22 1554  BP: 130/84  Pulse: 99  Weight: 293 lb (132.9 kg)    Fetal Status: Fetal Heart Rate (bpm): 135 Fundal Height: 34 cm Movement: Present     General:  Alert, oriented and cooperative. Patient is in no acute distress.  Skin: Skin is warm and dry. No rash noted.   Cardiovascular: Normal heart rate noted  Respiratory: Normal respiratory effort, no problems with respiration noted  Abdomen: Soft, gravid, appropriate for gestational age.  Pain/Pressure: Present     Pelvic: Cervical exam deferred        Extremities: Normal range of motion.     Mental Status: Normal mood and affect. Normal behavior. Normal judgment and thought content.   Assessment and Plan:  Pregnancy: G1P0 at 395w1d1. Supervision of normal first pregnancy, antepartum [place] frequent. Reviewed hydration.    2. Marginal insertion of umbilical cord affecting management of mother in second trimester Growth has been appropriate EFW 97th% and AC 97th% 3. Obesity in pregnancy TWG= 19 lb (8.618 kg)   Preterm labor symptoms and general obstetric precautions including but  not limited to vaginal bleeding, contractions, leaking of fluid and fetal movement were reviewed in detail with the patient. Please refer to After Visit Summary for other counseling recommendations.   No follow-ups on file.  Future Appointments  Date Time Provider DeMountain Meadows12/05/2022  1:00 PM [facility]  10/20/2022  2:30 PM Anyanwu, UgSallyanne HaversMD [facility]  10/27/2022  2:00 PM [facility]  11/02/2022  2:30 PM PiAletha HalimMD [facility]  11/03/2022  2:00 PM [facility] US[facility] MFM[facility] [place]12/28/2023  2:30 PM WeDarlina RumpfCNM [facility]    KiCaren MacadamMD

## 2022-10-05 ENCOUNTER — Encounter: Payer: Self-pay | Admitting: Family Medicine

## 2022-10-18 ENCOUNTER — Other Ambulatory Visit: Payer: Self-pay

## 2022-10-18 DIAGNOSIS — O99210 Obesity complicating pregnancy, unspecified trimester: Secondary | ICD-10-CM

## 2022-10-20 ENCOUNTER — Ambulatory Visit: Payer: BC Managed Care – PPO | Attending: Obstetrics

## 2022-10-20 ENCOUNTER — Ambulatory Visit (INDEPENDENT_AMBULATORY_CARE_PROVIDER_SITE_OTHER): Payer: BC Managed Care – PPO | Admitting: Obstetrics & Gynecology

## 2022-10-20 ENCOUNTER — Other Ambulatory Visit: Payer: Self-pay

## 2022-10-20 ENCOUNTER — Other Ambulatory Visit: Payer: BC Managed Care – PPO

## 2022-10-20 VITALS — BP 131/84 | HR 96 | Wt 298.0 lb

## 2022-10-20 DIAGNOSIS — E669 Obesity, unspecified: Secondary | ICD-10-CM | POA: Diagnosis not present

## 2022-10-20 DIAGNOSIS — Z3A34 34 weeks gestation of pregnancy: Secondary | ICD-10-CM | POA: Insufficient documentation

## 2022-10-20 DIAGNOSIS — O99213 Obesity complicating pregnancy, third trimester: Secondary | ICD-10-CM | POA: Diagnosis not present

## 2022-10-20 DIAGNOSIS — O43193 Other malformation of placenta, third trimester: Secondary | ICD-10-CM

## 2022-10-20 DIAGNOSIS — O99210 Obesity complicating pregnancy, unspecified trimester: Secondary | ICD-10-CM

## 2022-10-20 DIAGNOSIS — O3663X0 Maternal care for excessive fetal growth, third trimester, not applicable or unspecified: Secondary | ICD-10-CM

## 2022-10-20 DIAGNOSIS — Z3400 Encounter for supervision of normal first pregnancy, unspecified trimester: Secondary | ICD-10-CM

## 2022-10-20 NOTE — Patient Instructions (Signed)
Return to office for any scheduled appointments. Call the office or go to the MAU at [facility] if: You begin to have strong, frequent contractions Your water breaks.  Sometimes it is a big gush of fluid, sometimes it is just a trickle that keeps getting your underwear wet or running down your legs You have vaginal bleeding.  It is normal to have a small amount of spotting if your cervix was checked.  You do not feel your baby moving like normal.  If you do not, get something to eat and drink and lay down and focus on feeling your baby move.   If your baby is still not moving like normal, you should call the office or go to MAU. Any other obstetric concerns.  

## 2022-10-20 NOTE — Progress Notes (Signed)
PRENATAL VISIT NOTE  Subjective:  Pamela Munoz is a 26 y.o. G1P0 at 40w4dbeing seen today for ongoing prenatal care.  She is currently monitored for the following issues for this high-risk pregnancy and has Supervision of normal pregnancy, antepartum; BMI 40.0-44.9, adult ([place]; Maternal morbid obesity, antepartum ([place]; Marginal insertion of umbilical cord affecting management of mother in third trimester; and Large for gestational age fetus affecting management of mother, antepartum, third trimester on their problem list.  Patient reports  pelvic pressure and passage of mucus filled discharge .  Contractions: Irritability. Vag. Bleeding: None.  Movement: Present. Denies leaking of fluid.   The following portions of the patient's history were reviewed and updated as appropriate: allergies, current medications, past family history, past medical history, past social history, past surgical history and problem list.   Objective:   Vitals:   10/20/22 1429  BP: 131/84  Pulse: 96  Weight: 298 lb (135.2 kg)    Fetal Status: Fetal Heart Rate (bpm): 155   Movement: Present  Presentation: Vertex  General:  Alert, oriented and cooperative. Patient is in no acute distress.  Skin: Skin is warm and dry. No rash noted.   Cardiovascular: Normal heart rate noted  Respiratory: Normal respiratory effort, no problems with respiration noted  Abdomen: Soft, gravid, appropriate for gestational age.  Pain/Pressure: Present     Pelvic: Cervical exam performed in the presence of a chaperone Dilation: Closed Effacement (%): Thick Station: Ballotable  Extremities: Normal range of motion.  Edema: Mild pitting, slight indentation  Mental Status: Normal mood and affect. Normal behavior. Normal judgment and thought content.   Imaging: [place]orea[place]FM OB FOLLOW UP  Result Date: 10/20/2022 ----------------------------------------------------------------------  OBSTETRICS REPORT                       (Signed Final  10/20/2022 01:47 pm) ---------------------------------------------------------------------- Patient Info  ID #:       [id number]                         D.O.B.:  [birth year](26 yrs)  Name:       Pamela Munoz                    Visit Date: 10/20/2022 12:58 pm ---------------------------------------------------------------------- Performed By  Attending:        VJohnell ComingsMD         Ref. Address:     [address] Performed By:     JRolm BookbinderRDMS     [facility]  Referred By:      [place] ---------------------------------------------------------------------- Orders  #  Description                           Code        Ordered By  1  [place] MFM OB FOLLOW UP                   [id number]    RAVI SHANKAR  2  [place] MFM FETAL BPP WO NON               76819.01    RAVI [facility]     STRESS ----------------------------------------------------------------------  #  Order #                     Accession #                Episode #  1  [id number]                   [id number]                 [id number]  2  [id number]                   [id number]                 [id number] ---------------------------------------------------------------------- Indications  Obesity complicating pregnancy, third          O99.213  trimester (pregravid BMI 45)  Marginal insertion of umbilical cord affecting O43.193  management of mother in third trimester  [redacted] weeks gestation of pregnancy                Z3A.34  Low risk NIPS ---------------------------------------------------------------------- Fetal Evaluation  Num Of Fetuses:         1  Fetal Heart Rate(bpm):  148  Cardiac Activity:       Observed  Placenta:               Posterior  P. Cord Insertion:      Marginal insert prev seen  Amniotic Fluid  AFI FV:       Within normal limits  AFI Sum(cm)     %Tile       Largest Pocket(cm)  14.96           54          7.18  RUQ(cm)       RLQ(cm)       LUQ(cm)        LLQ(cm)  1.43          7.18          1.65           4.7 ---------------------------------------------------------------------- Biophysical Evaluation  Amniotic F.V:   Within normal limits       F. Tone:        Observed  F. Movement:    Observed                   Score:          8/8  F. Breathing:   Observed ---------------------------------------------------------------------- Biometry  BPD:     97.35  mm     G. Age:  39w 6d       > 99  %    CI:         79.3   %  70 - 86                                                          FL/HC:      19.9   %    20.1 - 22.3  HC:    345.58   mm     G. Age:  40w 0d       > 99  %    HC/AC:      1.05        0.93 - 1.11  AC:    328.82   mm     G. Age:  36w 6d         97  %    FL/BPD:     70.5   %    71 - 87  FL:      68.64  mm     G. Age:  35w 2d         60  %    FL/AC:      20.9   %    20 - 24  Est. FW:    3114  gm    6 lb 14 oz      97  % ---------------------------------------------------------------------- OB History  Gravidity:    1         Term:   0        Prem:   0        SAB:   0  TOP:          0       Ectopic:  0        Living: 0 ---------------------------------------------------------------------- Gestational Age  LMP:           34w 4d        Date:  02/20/22                   EDD:   11/27/22  U/S Today:     38w 0d                                        EDD:   11/03/22  Best:          34w 4d     Det. By:  LMP  (02/20/22)          EDD:   11/27/22 ---------------------------------------------------------------------- Anatomy  Cranium:               Previously seen        Kidneys:                Appear normal  Stomach:               Appears normal, left   Bladder:                Appears normal                         sided  Other:  All anatomy previously seen ----------------------------------------------------------------------  Comments  This patient was seen for a follow up growth scan and BPP  due to maternal obesity with a BMI of 45.  She denies any  problems since her last exam and has screened negative for  gestational diabetes.  The overall EFW of 6 pounds 14 ounces continues to  measure at the 97th percentile for her gestational age.  There  was normal amniotic fluid noted.  A BPP performed today was 8 out of 8.  Due to maternal obesity, she will return in 1 week for another  BPP. ----------------------------------------------------------------------                   Johnell Comings, MD Electronically Signed Final Report   10/20/2022 01:47 pm ----------------------------------------------------------------------  [place] MFM FETAL BPP WO NON STRESS  Result Date: 10/20/2022 ----------------------------------------------------------------------  OBSTETRICS REPORT                       (Signed Final 10/20/2022 01:47 pm) ---------------------------------------------------------------------- Patient Info  ID #:       [id number]                          D.O.B.:  [birth year] (26 yrs)  Name:       Percell Miller Munoz                    Visit Date: 10/20/2022 12:58 pm ---------------------------------------------------------------------- Performed By  Attending:        Johnell Comings MD         Ref. Address:     [place]  Performed By:     Rolm Bookbinder RDMS     Location[facility]         [facility] at                                                             [place] By:      [place] ---------------------------------------------------------------------- Orders  #  Description                           Code        Ordered By  1  [place] MFM OB FOLLOW UP                   [id number]    RAVI SHANKAR  2  [place] MFM FETAL BPP WO NON               87681.15    RAVI SHANKAR     STRESS  ----------------------------------------------------------------------  #  Order #  Accession #                Episode #  1  [id number]                   [id number]                 [id number]  2  [id number]                   [id number]                 [id number] ---------------------------------------------------------------------- Indications  Obesity complicating pregnancy, third          O99.213  trimester (pregravid BMI 45)  Marginal insertion of umbilical cord affecting O43.193  management of mother in third trimester  [redacted] weeks gestation of pregnancy                Z3A.34  Low risk NIPS ---------------------------------------------------------------------- Fetal Evaluation  Num Of Fetuses:         1  Fetal Heart Rate(bpm):  148  Cardiac Activity:       Observed  Placenta:               Posterior  P. Cord Insertion:      Marginal insert prev seen  Amniotic Fluid  AFI FV:      Within normal limits  AFI Sum(cm)     %Tile       Largest Pocket(cm)  14.96           54          7.18  RUQ(cm)       RLQ(cm)       LUQ(cm)        LLQ(cm)  1.43          7.18          1.65           4.7 ---------------------------------------------------------------------- Biophysical Evaluation  Amniotic F.V:   Within normal limits       F. Tone:        Observed  F. Movement:    Observed                   Score:          8/8  F. Breathing:   Observed ---------------------------------------------------------------------- Biometry  BPD:     97.35  mm     G. Age:  39w 6d       > 99  %    CI:         79.3   %    70 - 86                                                          FL/HC:      19.9   %    20.1 - 22.3  HC:    345.58   mm     G. Age:  40w 0d       > 99  %    HC/AC:      1.05        0.93 - 1.11  AC:    328.82   mm     G. Age:  36w 6d  97  %    FL/BPD:     70.5   %    71 - 87  FL:      68.64  mm     G. Age:  35w 2d         60  %    FL/AC:      20.9   %    20 - 24  Est. FW:    3114  gm    6 lb 14 oz      97  %  ---------------------------------------------------------------------- OB History  Gravidity:    1         Term:   0        Prem:   0        SAB:   0  TOP:          0       Ectopic:  0        Living: 0 ---------------------------------------------------------------------- Gestational Age  LMP:           34w 4d        Date:  02/20/22                   EDD:   11/27/22  U/S Today:     38w 0d                                        EDD:   11/03/22  Best:          34w 4d     Det. By:  LMP  (02/20/22)          EDD:   11/27/22 ---------------------------------------------------------------------- Anatomy  Cranium:               Previously seen        Kidneys:                Appear normal  Stomach:               Appears normal, left   Bladder:                Appears normal                         sided  Other:  All anatomy previously seen ---------------------------------------------------------------------- Comments  This patient was seen for a follow up growth scan and BPP  due to maternal obesity with a BMI of 45.  She denies any  problems since her last exam and has screened negative for  gestational diabetes.  The overall EFW of 6 pounds 14 ounces continues to  measure at the 97th percentile for her gestational age.  There  was normal amniotic fluid noted.  A BPP performed today was 8 out of 8.  Due to maternal obesity, she will return in 1 week for another  BPP. ----------------------------------------------------------------------                   Johnell Comings, MD Electronically Signed Final Report   10/20/2022 01:47 pm ----------------------------------------------------------------------   Assessment and Plan:  Pregnancy: G1P0 at 78w4d1. Maternal morbid obesity, antepartum (HCC) TWG 24 lb, BMI 48.  BPP 8/8. Continue antenatal testing and ultrasounds as per MFM and follow MFM recommendations.   2. Large for gestational age fetus affecting management of mother, antepartum, third  trimester 12/7  53w4dEFW 3114 gm/6  lb 14 oz/97 %, AC 97% Will follow growth scans, patient aware that delivery modality may be affected by fetal size.  3. Marginal insertion of umbilical cord affecting management of mother in third trimester LGA fetus, no concerns about fetal growth being compromised  4. [redacted] weeks gestation of pregnancy 5. Supervision of normal first pregnancy, antepartum Closed cervix, patient reassured.  Preterm labor symptoms and general obstetric precautions including but not limited to vaginal bleeding, contractions, leaking of fluid and fetal movement were reviewed in detail with the patient. Please refer to After Visit Summary for other counseling recommendations.   Return in about 2 weeks (around 11/03/2022) for OFFICE OB VISIT (MD only).  Future Appointments  Date Time Provider [place] 10/27/2022  2:00 PM [facility]  11/02/2022  2:30 PM PAletha Halim MD [facility]  11/03/2022  2:00 PM [facility] US[facility] [place] 11/10/2022  2:30 PM WDarlina Rumpf CNM [facility]  11/16/2022  2:30 PM PDonnamae Jude MD [facility]  11/23/2022  2:30 PM PDonnamae Jude MD [facility]  11/30/2022  2:30 PM PDonnamae Jude MD [facility]    UVerita Schneiders MD

## 2022-10-20 NOTE — Progress Notes (Signed)
ROB [redacted]w[redacted]d Pt had U/S and BPP today  CC: pt states she is unsure if she lost her mucous plug has pictures to show provider.  Pt would like cervix exam due to pressure.

## 2022-10-23 ENCOUNTER — Encounter (HOSPITAL_COMMUNITY): Payer: Self-pay | Admitting: Family Medicine

## 2022-10-23 ENCOUNTER — Inpatient Hospital Stay (HOSPITAL_COMMUNITY)
Admission: AD | Admit: 2022-10-23 | Discharge: 2022-10-23 | Disposition: A | Discharge status: Home / Self Care | Payer: BC Managed Care – PPO | Attending: Family Medicine | Admitting: Family Medicine

## 2022-10-23 DIAGNOSIS — R102 Pelvic and perineal pain: Secondary | ICD-10-CM | POA: Diagnosis not present

## 2022-10-23 DIAGNOSIS — O479 False labor, unspecified: Secondary | ICD-10-CM

## 2022-10-23 DIAGNOSIS — O99213 Obesity complicating pregnancy, third trimester: Secondary | ICD-10-CM | POA: Insufficient documentation

## 2022-10-23 DIAGNOSIS — Z3A35 35 weeks gestation of pregnancy: Secondary | ICD-10-CM

## 2022-10-23 DIAGNOSIS — M549 Dorsalgia, unspecified: Secondary | ICD-10-CM | POA: Insufficient documentation

## 2022-10-23 DIAGNOSIS — O4703 False labor before 37 completed weeks of gestation, third trimester: Secondary | ICD-10-CM | POA: Diagnosis not present

## 2022-10-23 DIAGNOSIS — O26893 Other specified pregnancy related conditions, third trimester: Secondary | ICD-10-CM | POA: Insufficient documentation

## 2022-10-23 LAB — URINALYSIS, ROUTINE W REFLEX MICROSCOPIC
Bilirubin Urine: NEGATIVE
Glucose, UA: NEGATIVE mg/dL
Hgb urine dipstick: NEGATIVE
Ketones, ur: 80 mg/dL — AB
Nitrite: NEGATIVE
Protein, ur: NEGATIVE mg/dL
Specific Gravity, Urine: 1.016 (ref 1.005–1.030)
pH: 6 (ref 5.0–8.0)

## 2022-10-23 NOTE — MAU Provider Note (Signed)
History     CSN: [id number]  Arrival date and time: 10/23/22 2026   None     Chief Complaint  Patient presents with   Contractions   Back Pain   HPI Pamela Munoz is a 26 y.o. G1P0 at 70w0dwho presents to MAU for contractions and pelvic pressure. Patient reports contractions started at approximately 11am today. Around lunch time, she began to notice a lot of "lightening crotch". Contractions and pelvic pain have been ongoing all day, currently reports contractions every 3-5 mins. Laying down improves pelvic pain while standing worsens it. She denies vaginal bleeding, leaking fluid, itching, odor, or urinary s/s. She endorses active fetal movement. Had cervical exam on Thursday and was closed.  [facility]    OB History     Gravida  1   Para      Term      Preterm      AB      Living         SAB      IAB      Ectopic      Multiple      Live Births              Past Medical History:  Diagnosis Date   Morbid obesity with BMI of 40.0-44.9, adult ([facility]    Osteochondroma 2011   left leg    Past Surgical History:  Procedure Laterality Date   ANTERIOR CRUCIATE LIGAMENT REPAIR Right 08/03/2017   Procedure: ANTERIOR CRUCIATE LIGAMENT (ACL) REPAIR;  Surgeon: PLeim Fabry MD;  [place]  Service: Orthopedics;  Laterality: Right;   KNEE ARTHROSCOPY Right 08/03/2017   Procedure: ARTHROSCOPY KNEERIGHT MEDIAL MENISCUS REPAIR;  Surgeon: PLeim Fabry MD;  [place]  Service: Orthopedics;  Laterality: Right;  MTF CANNULATED DOWELS- WES CONN STRYKER VERSITOMIC REAMERS +/- INJECTABLE MAP3 CMaliFRYE ARTHREX DILATORS CORING REAMERS +/- DBM ALEX ROBERTS BIOMET REMOVAL DEVICE (POSSIBLE) (UNKNOWN REP-TRYING TO FIND OUT 70 DEGREE ARTHROSCOPE   KNEE ARTHROSCOPY WITH ANTERIOR CRUCIATE LIGAMENT (ACL) REPAIR Right    KNEE SURGERY Left    bone spur   TONSILLECTOMY     WISDOM TOOTH  EXTRACTION      Family History  Problem Relation Age of Onset   Endometriosis Mother    Diabetes Maternal Grandfather     Social History   Tobacco Use   Smoking status: Never   Smokeless tobacco: Never  Vaping Use   Vaping Use: Never used  Substance Use Topics   Alcohol use: No   Drug use: No    Allergies:  Allergies  Allergen Reactions   Percocet [Oxycodone-Acetaminophen] Itching    Medications Prior to Admission  Medication Sig Dispense Refill Last Dose   aspirin EC 81 MG tablet Take 1 tablet (81 mg total) by mouth daily. Swallow whole. Take 1 daily until you go into labor for Preeclampsia prophylaxis 30 tablet 12 10/23/2022   Prenatal Vit-Fe Fumarate-FA (PRENATAL MULTIVITAMIN) TABS tablet Take 1 tablet by mouth daily at 12 noon.   10/23/2022   famotidine (PEPCID) 20 MG tablet Take 1 tablet (20 mg total) by mouth 2 (two) times daily. (Patient not taking: Reported on 08/04/2022) 90 tablet 3    loratadine (CLARITIN) 10 MG tablet Take 1 tablet (10 mg total) by mouth daily. (Patient not taking: Reported on 08/04/2022) 30 tablet 0    Review of Systems  Constitutional: Negative.   Respiratory: Negative.  Cardiovascular: Negative.   Gastrointestinal:  Positive for abdominal pain (contractions).  Genitourinary:  Positive for pelvic pain.  Musculoskeletal: Negative.    Physical Exam   Blood pressure 125/84, pulse (!) 107, temperature 97.7 F (36.5 C), temperature source Oral, resp. rate 20, weight 135.5 kg, last menstrual period 02/20/2022, SpO2 100 %.  Physical Exam Vitals and nursing note reviewed. Exam conducted with a chaperone present.  Constitutional:      Appearance: She is obese.  Eyes:     Extraocular Movements: Extraocular movements intact.     Pupils: Pupils are equal, round, and reactive to light.  Cardiovascular:     Rate and Rhythm: Tachycardia present.  Pulmonary:     Effort: Pulmonary effort is normal.  Abdominal:     Palpations: Abdomen is soft.      Tenderness: There is no abdominal tenderness.     Comments: Gravid   Musculoskeletal:        General: Normal range of motion.     Cervical back: Normal range of motion.  Skin:    General: Skin is warm and dry.  Neurological:     General: No focal deficit present.     Mental Status: She is alert and oriented to person, place, and time.  Psychiatric:        Mood and Affect: Mood normal.        Behavior: Behavior normal.   Dilation: Fingertip Exam by:: Maryagnes Amos, CNM  NST FHR: 135 bpm, moderate variability, +15x15 accels, no decels Toco: Q 7-8 mins  MAU Course  Procedures  MDM UA NST  UA with ketonuria, trace leukocytes and many bacteria. Urine culture added on.  NST reactive and reassuring. Cervix FT/Thick. Reassurance provided to patient and husband regarding irregular contractions and pelvic pain. Reviewed supportive measures to help with pelvic pain/pressure including support belt/athletic tape, adequate hydration, frequent rest periods/avoiding standing for long periods.   Assessment and Plan  [redacted] weeks gestation of pregnancy Braxton hicks contractions  - Discharge home in stable condition - Strict return precautions. Return to MAU as needed for new/worsening symptoms - Keep OB appointment as scheduled    Renee Harder, CNM 10/23/2022, 9:56 PM

## 2022-10-23 NOTE — MAU Note (Addendum)
.  Pamela Munoz is a 26 y.o. at 36w0dhere in MAU reporting: started having contractions last night but now they are every 3-4 minutes. States they are not bad when she is sitting but when she stands they are 8/10 and she feels a lot of pelvic pressure and back pain. Denies VB or LOF. +FM.  Pain score: 8 Vitals:   10/23/22 2048  BP: 125/84  Pulse: (!) 107  Resp: 20  Temp: 97.7 F (36.5 C)  SpO2: 100%      Lab orders placed from triage:  UA

## 2022-10-24 LAB — CULTURE, OB URINE

## 2022-10-25 ENCOUNTER — Other Ambulatory Visit: Payer: Self-pay

## 2022-10-25 DIAGNOSIS — O99210 Obesity complicating pregnancy, unspecified trimester: Secondary | ICD-10-CM

## 2022-10-27 ENCOUNTER — Ambulatory Visit: Payer: BC Managed Care – PPO | Attending: Obstetrics

## 2022-10-27 ENCOUNTER — Other Ambulatory Visit: Payer: Self-pay

## 2022-10-27 DIAGNOSIS — E669 Obesity, unspecified: Secondary | ICD-10-CM

## 2022-10-27 DIAGNOSIS — Z3A35 35 weeks gestation of pregnancy: Secondary | ICD-10-CM | POA: Diagnosis not present

## 2022-10-27 DIAGNOSIS — O43193 Other malformation of placenta, third trimester: Secondary | ICD-10-CM | POA: Diagnosis not present

## 2022-10-27 DIAGNOSIS — O99210 Obesity complicating pregnancy, unspecified trimester: Secondary | ICD-10-CM

## 2022-10-27 DIAGNOSIS — O99213 Obesity complicating pregnancy, third trimester: Secondary | ICD-10-CM | POA: Diagnosis not present

## 2022-11-01 ENCOUNTER — Other Ambulatory Visit: Payer: Self-pay

## 2022-11-01 DIAGNOSIS — O99210 Obesity complicating pregnancy, unspecified trimester: Secondary | ICD-10-CM

## 2022-11-02 ENCOUNTER — Encounter: Payer: Self-pay | Admitting: Obstetrics and Gynecology

## 2022-11-02 ENCOUNTER — Other Ambulatory Visit (HOSPITAL_COMMUNITY)
Admission: RE | Admit: 2022-11-02 | Discharge: 2022-11-02 | Disposition: A | Discharge status: Home / Self Care | Payer: BC Managed Care – PPO | Source: Ambulatory Visit | Attending: Obstetrics and Gynecology | Admitting: Obstetrics and Gynecology

## 2022-11-02 ENCOUNTER — Ambulatory Visit (INDEPENDENT_AMBULATORY_CARE_PROVIDER_SITE_OTHER): Payer: BC Managed Care – PPO | Admitting: Obstetrics and Gynecology

## 2022-11-02 VITALS — BP 137/87 | HR 93 | Wt 298.4 lb

## 2022-11-02 DIAGNOSIS — Z3493 Encounter for supervision of normal pregnancy, unspecified, third trimester: Secondary | ICD-10-CM | POA: Diagnosis not present

## 2022-11-02 DIAGNOSIS — Z6841 Body Mass Index (BMI) 40.0 and over, adult: Secondary | ICD-10-CM

## 2022-11-02 DIAGNOSIS — O26893 Other specified pregnancy related conditions, third trimester: Secondary | ICD-10-CM | POA: Insufficient documentation

## 2022-11-02 DIAGNOSIS — O99213 Obesity complicating pregnancy, third trimester: Secondary | ICD-10-CM

## 2022-11-02 DIAGNOSIS — O3663X0 Maternal care for excessive fetal growth, third trimester, not applicable or unspecified: Secondary | ICD-10-CM

## 2022-11-02 DIAGNOSIS — Z3A36 36 weeks gestation of pregnancy: Secondary | ICD-10-CM | POA: Insufficient documentation

## 2022-11-02 NOTE — Progress Notes (Signed)
    PRENATAL VISIT NOTE  Subjective:  Pamela Munoz is a 26 y.o. G1P0 at 71w3dbeing seen today for ongoing prenatal care.  She is currently monitored for the following issues for this low-risk pregnancy and has Supervision of normal pregnancy, antepartum; BMI 40.0-44.9, adult ([place]; Maternal morbid obesity, antepartum ([place]; Marginal insertion of umbilical cord affecting management of mother in third trimester; and Large for gestational age fetus affecting management of mother, antepartum, third trimester on their problem list.  Patient reports no complaints.  Contractions: Irritability. Vag. Bleeding: None.  Movement: Present. Denies leaking of fluid.   The following portions of the patient's history were reviewed and updated as appropriate: allergies, current medications, past family history, past medical history, past social history, past surgical history and problem list.   Objective:   Vitals:   11/02/22 1454  BP: 137/87  Pulse: 93  Weight: 298 lb 6.4 oz (135.4 kg)    Fetal Status: Fetal Heart Rate (bpm): 142   Movement: Present     General:  Alert, oriented and cooperative. Patient is in no acute distress.  Skin: Skin is warm and dry. No rash noted.   Cardiovascular: Normal heart rate noted  Respiratory: Normal respiratory effort, no problems with respiration noted  Abdomen: Soft, gravid, appropriate for gestational age.  Pain/Pressure: Present     Pelvic: Cervical exam performed in the presence of a chaperone Dilation: 1 Effacement (%): 20 Station: -3  Extremities: Normal range of motion.  Edema: Trace  Mental Status: Normal mood and affect. Normal behavior. Normal judgment and thought content.   Assessment and Plan:  Pregnancy: G1P0 at 311w3d. [redacted] weeks gestation of pregnancy - Strep Gp B NAA - Cervicovaginal ancillary only( [place] 2. Large for gestational age fetus affecting management of mother, antepartum, third trimester D/w her and patient amenable to 39wk  IOL for LGA. Pelvis feels adequate. Dates d/w her and will let usKoreanow next visit re: scheduling 12/7: 97%, 3114gm, ac >97%  3. BMI 40.0-44.9, adult (HCC) Continue qwk testing.   4. Maternal morbid obesity, antepartum ([place] Preterm labor symptoms and general obstetric precautions including but not limited to vaginal bleeding, contractions, leaking of fluid and fetal movement were reviewed in detail with the patient. Please refer to After Visit Summary for other counseling recommendations.   Return for in person or virtual.  Future Appointments  Date Time Provider [place]/2023  2:00 PM [facility] US[facility]  11/10/2022 10:00 AM [facility] NST [facility] None  11/10/2022  2:30 PM WeKieth BrightlyWH-WSCA [facility]  11/16/2022  2:30 PM PrDonnamae JudeMD [facility]  11/17/2022 11:30 AM [facility] US1 [facility] MF[place]1/08/2023  2:30 PM PrDonnamae JudeMD [facility]  11/30/2022  2:30 PM PrDonnamae JudeMD [facility]    ChAletha HalimMD

## 2022-11-02 NOTE — Progress Notes (Signed)
Pt presents for ROB, GBC, GC/CT. She reports congestion, runny nose, and coughing. Pt also reports "insomnia."

## 2022-11-03 ENCOUNTER — Other Ambulatory Visit: Payer: Self-pay

## 2022-11-03 ENCOUNTER — Ambulatory Visit: Payer: BC Managed Care – PPO | Attending: Obstetrics

## 2022-11-03 DIAGNOSIS — O99213 Obesity complicating pregnancy, third trimester: Secondary | ICD-10-CM | POA: Insufficient documentation

## 2022-11-03 DIAGNOSIS — Z3A36 36 weeks gestation of pregnancy: Secondary | ICD-10-CM | POA: Insufficient documentation

## 2022-11-03 DIAGNOSIS — O99210 Obesity complicating pregnancy, unspecified trimester: Secondary | ICD-10-CM

## 2022-11-03 DIAGNOSIS — E669 Obesity, unspecified: Secondary | ICD-10-CM | POA: Diagnosis not present

## 2022-11-03 DIAGNOSIS — O43193 Other malformation of placenta, third trimester: Secondary | ICD-10-CM | POA: Diagnosis not present

## 2022-11-04 LAB — STREP GP B NAA: Strep Gp B NAA: NEGATIVE

## 2022-11-07 ENCOUNTER — Inpatient Hospital Stay (HOSPITAL_COMMUNITY): Payer: BC Managed Care – PPO | Admitting: Anesthesiology

## 2022-11-07 ENCOUNTER — Inpatient Hospital Stay (HOSPITAL_COMMUNITY)
Admission: AD | Admit: 2022-11-07 | Discharge: 2022-11-10 | DRG: 806 | Disposition: A | Discharge status: Home / Self Care | Payer: BC Managed Care – PPO | Attending: Family Medicine | Admitting: Family Medicine

## 2022-11-07 ENCOUNTER — Encounter (HOSPITAL_COMMUNITY): Payer: Self-pay | Admitting: Obstetrics and Gynecology

## 2022-11-07 ENCOUNTER — Other Ambulatory Visit: Payer: Self-pay

## 2022-11-07 DIAGNOSIS — O99214 Obesity complicating childbirth: Secondary | ICD-10-CM | POA: Diagnosis present

## 2022-11-07 DIAGNOSIS — Z3A37 37 weeks gestation of pregnancy: Secondary | ICD-10-CM

## 2022-11-07 DIAGNOSIS — O4423 Partial placenta previa NOS or without hemorrhage, third trimester: Secondary | ICD-10-CM | POA: Diagnosis not present

## 2022-11-07 DIAGNOSIS — D62 Acute posthemorrhagic anemia: Secondary | ICD-10-CM | POA: Diagnosis not present

## 2022-11-07 DIAGNOSIS — O43193 Other malformation of placenta, third trimester: Secondary | ICD-10-CM | POA: Diagnosis present

## 2022-11-07 DIAGNOSIS — O4202 Full-term premature rupture of membranes, onset of labor within 24 hours of rupture: Secondary | ICD-10-CM | POA: Diagnosis not present

## 2022-11-07 DIAGNOSIS — O9081 Anemia of the puerperium: Secondary | ICD-10-CM | POA: Diagnosis not present

## 2022-11-07 DIAGNOSIS — O26893 Other specified pregnancy related conditions, third trimester: Secondary | ICD-10-CM | POA: Diagnosis not present

## 2022-11-07 DIAGNOSIS — O43123 Velamentous insertion of umbilical cord, third trimester: Secondary | ICD-10-CM | POA: Diagnosis not present

## 2022-11-07 DIAGNOSIS — O3663X0 Maternal care for excessive fetal growth, third trimester, not applicable or unspecified: Secondary | ICD-10-CM | POA: Diagnosis not present

## 2022-11-07 DIAGNOSIS — O135 Gestational [pregnancy-induced] hypertension without significant proteinuria, complicating the puerperium: Secondary | ICD-10-CM | POA: Diagnosis not present

## 2022-11-07 DIAGNOSIS — O134 Gestational [pregnancy-induced] hypertension without significant proteinuria, complicating childbirth: Secondary | ICD-10-CM | POA: Diagnosis not present

## 2022-11-07 LAB — RPR: RPR Ser Ql: NONREACTIVE

## 2022-11-07 LAB — COMPREHENSIVE METABOLIC PANEL
ALT: 16 U/L (ref 0–44)
AST: 23 U/L (ref 15–41)
Albumin: 2.7 g/dL — ABNORMAL LOW (ref 3.5–5.0)
Alkaline Phosphatase: 143 U/L — ABNORMAL HIGH (ref 38–126)
Anion gap: 10 (ref 5–15)
BUN: 5 mg/dL — ABNORMAL LOW (ref 6–20)
CO2: 18 mmol/L — ABNORMAL LOW (ref 22–32)
Calcium: 9.2 mg/dL (ref 8.9–10.3)
Chloride: 109 mmol/L (ref 98–111)
Creatinine, Ser: 0.58 mg/dL (ref 0.44–1.00)
GFR, Estimated: >60 mL/min (ref >60)
Glucose, Bld: 102 mg/dL — ABNORMAL HIGH (ref 70–99)
Potassium: 4 mmol/L (ref 3.5–5.1)
Sodium: 137 mmol/L (ref 135–145)
Total Bilirubin: 0.5 mg/dL (ref 0.3–1.2)
Total Protein: 5.8 g/dL — ABNORMAL LOW (ref 6.5–8.1)

## 2022-11-07 LAB — CBC
HCT: 38.3 % (ref 36.0–46.0)
Hemoglobin: 12.9 g/dL (ref 12.0–15.0)
MCH: 28 pg (ref 26.0–34.0)
MCHC: 33.7 g/dL (ref 30.0–36.0)
MCV: 83.1 fL (ref 80.0–100.0)
Platelets: 192 10*3/uL (ref 150–400)
RBC: 4.61 MIL/uL (ref 3.87–5.11)
RDW: 13.8 % (ref 11.5–15.5)
WBC: 7.5 10*3/uL (ref 4.0–10.5)
nRBC: 0 % (ref 0.0–0.2)

## 2022-11-07 LAB — PROTEIN / CREATININE RATIO, URINE
Creatinine, Urine: 178 mg/dL
Protein Creatinine Ratio: 0.21 mg/mg{Cre} — ABNORMAL HIGH (ref 0.00–0.15)
Total Protein, Urine: 37 mg/dL

## 2022-11-07 LAB — POCT FERN TEST

## 2022-11-07 MED ORDER — LACTATED RINGERS IV SOLN
INTRAVENOUS | Status: DC
  Administered 2022-11-08: 999 mL via INTRAVENOUS

## 2022-11-07 MED ORDER — LIDOCAINE HCL (PF) 1 % IJ SOLN
30.0000 mL | INTRAMUSCULAR | Status: AC | PRN
  Administered 2022-11-08: 30 mL via SUBCUTANEOUS
  Filled 2022-11-07: qty 30

## 2022-11-07 MED ORDER — OXYTOCIN-SODIUM CHLORIDE 30-0.9 UT/500ML-% IV SOLN
1.0000 m[IU]/min | INTRAVENOUS | Status: DC
  Administered 2022-11-07: 2 m[IU]/min via INTRAVENOUS
  Filled 2022-11-07: qty 500

## 2022-11-07 MED ORDER — LACTATED RINGERS IV SOLN
500.0000 mL | Freq: Once | INTRAVENOUS | Status: AC
  Administered 2022-11-07: 500 mL via INTRAVENOUS

## 2022-11-07 MED ORDER — ACETAMINOPHEN 325 MG PO TABS
650.0000 mg | ORAL_TABLET | ORAL | Status: DC | PRN
  Filled 2022-11-07: qty 2

## 2022-11-07 MED ORDER — MISOPROSTOL 25 MCG QUARTER TABLET
25.0000 ug | ORAL_TABLET | Freq: Once | ORAL | Status: AC
  Administered 2022-11-07: 25 ug via VAGINAL
  Filled 2022-11-07: qty 1

## 2022-11-07 MED ORDER — MISOPROSTOL 50MCG HALF TABLET
50.0000 ug | ORAL_TABLET | Freq: Once | ORAL | Status: AC
  Administered 2022-11-07: 50 ug via ORAL
  Filled 2022-11-07: qty 1

## 2022-11-07 MED ORDER — LACTATED RINGERS IV SOLN
500.0000 mL | INTRAVENOUS | Status: DC | PRN
  Administered 2022-11-08: 500 mL via INTRAVENOUS

## 2022-11-07 MED ORDER — PHENYLEPHRINE 80 MCG/ML (10ML) SYRINGE FOR IV PUSH (FOR BLOOD PRESSURE SUPPORT): 80.0000 ug | PREFILLED_SYRINGE | INTRAVENOUS | Status: DC | PRN

## 2022-11-07 MED ORDER — DIPHENHYDRAMINE HCL 50 MG/ML IJ SOLN: 12.5000 mg | INTRAMUSCULAR | Status: DC | PRN

## 2022-11-07 MED ORDER — OXYTOCIN-SODIUM CHLORIDE 30-0.9 UT/500ML-% IV SOLN
2.5000 [IU]/h | INTRAVENOUS | Status: DC
  Administered 2022-11-08 (×2): 2.5 [IU]/h via INTRAVENOUS
  Filled 2022-11-07: qty 500

## 2022-11-07 MED ORDER — LIDOCAINE HCL (PF) 1 % IJ SOLN
INTRAMUSCULAR | Status: DC | PRN
  Administered 2022-11-07 (×2): 5 mL via EPIDURAL

## 2022-11-07 MED ORDER — ONDANSETRON HCL 4 MG/2ML IJ SOLN
4.0000 mg | Freq: Four times a day (QID) | INTRAMUSCULAR | Status: DC | PRN
  Administered 2022-11-07: 4 mg via INTRAVENOUS
  Filled 2022-11-07: qty 2

## 2022-11-07 MED ORDER — TERBUTALINE SULFATE 1 MG/ML IJ SOLN: 0.2500 mg | Freq: Once | INTRAMUSCULAR | Status: DC | PRN

## 2022-11-07 MED ORDER — OXYTOCIN BOLUS FROM INFUSION
333.0000 mL | Freq: Once | INTRAVENOUS | Status: AC
  Administered 2022-11-08: 333 mL via INTRAVENOUS

## 2022-11-07 MED ORDER — OXYCODONE-ACETAMINOPHEN 5-325 MG PO TABS: 1.0000 | ORAL_TABLET | ORAL | Status: DC | PRN

## 2022-11-07 MED ORDER — EPHEDRINE 5 MG/ML INJ: 10.0000 mg | INTRAVENOUS | Status: DC | PRN

## 2022-11-07 MED ORDER — FENTANYL-BUPIVACAINE-NACL 0.5-0.125-0.9 MG/250ML-% EP SOLN
12.0000 mL/h | EPIDURAL | Status: DC | PRN
  Administered 2022-11-07: 12 mL/h via EPIDURAL
  Filled 2022-11-07: qty 250

## 2022-11-07 MED ORDER — SOD CITRATE-CITRIC ACID 500-334 MG/5ML PO SOLN: 30.0000 mL | ORAL | Status: DC | PRN

## 2022-11-07 MED ORDER — OXYCODONE-ACETAMINOPHEN 5-325 MG PO TABS: 2.0000 | ORAL_TABLET | ORAL | Status: DC | PRN

## 2022-11-07 NOTE — MAU Note (Signed)
.  Pamela Munoz is a 26 y.o. at 38w1dhere in MAU reporting: SROM at 0400 with clear amniotic fluid. Denies vaginal bleeding or bloody show. Endorses + fetal movement. GBS- LGA /obesity -denies additional complications or risk factors with pregnancy Denies regular or painful contractions. States she is beginning to feel contractions that are stronger than her braxton hicks.  Onset of complaint: 0400 Pain score: 2 lower abdomen Vitals:   11/07/22 0507  BP: (!) 140/71  Pulse: (!) 108  Resp: 18  Temp: 97.7 F (36.5 C)  SpO2: 100%     FHT:142bpm Lab orders placed from triage: mau labor

## 2022-11-07 NOTE — Anesthesia Procedure Notes (Signed)
Epidural Patient location during procedure: OB Start time: 11/07/2022 10:42 AM End time: 11/07/2022 10:53 AM  Staffing Anesthesiologist: Josephine Igo, MD Performed: anesthesiologist   Preanesthetic Checklist Completed: patient identified, IV checked, site marked, risks and benefits discussed, surgical consent, monitors and equipment checked, pre-op evaluation and timeout performed  Epidural Patient position: sitting Prep: DuraPrep and site prepped and draped Patient monitoring: continuous pulse ox and blood pressure Approach: midline Location: L3-L4 Injection technique: LOR air  Needle:  Needle type: Tuohy  Needle gauge: 17 G Needle length: 9 cm and 9 Needle insertion depth: 7 cm Catheter type: closed end flexible Catheter size: 19 Gauge Catheter at skin depth: 13 cm Test dose: negative and Other  Assessment Events: blood not aspirated, no cerebrospinal fluid, injection not painful, no injection resistance, no paresthesia and negative IV test  Additional Notes Patient identified. Risks and benefits discussed including failed block, incomplete  Pain control, post dural puncture headache, nerve damage, paralysis, blood pressure Changes, nausea, vomiting, reactions to medications-both toxic and allergic and post Partum back pain. All questions were answered. Patient expressed understanding and wished to proceed. Sterile technique was used throughout procedure. Epidural site was Dressed with sterile barrier dressing. No paresthesias, signs of intravascular injection Or signs of intrathecal spread were encountered. Technically difficult due to MO and very poor positioning. Attempt x 3. Patient was more comfortable after the epidural was dosed. Please see RN's note for documentation of vital signs and FHR which are stable. Reason for block:procedure for pain

## 2022-11-07 NOTE — Progress Notes (Signed)
LABOR PROGRESS NOTE  Pamela Munoz is a 26 y.o. G1P0 at [redacted]w[redacted]d admitted for SROM.  Subjective: No issues  Objective: BP (!) 140/82   Pulse 90   Temp 98.4 F (36.9 C) (Oral)   Resp 18   Ht '5\' 6"'$  (1.676 m)   Wt 135.4 kg   LMP 02/20/2022   SpO2 99%   BMI 48.20 kg/m  or  Vitals:   11/07/22 1445 11/07/22 1502 11/07/22 1532 11/07/22 1602  BP: 138/81 (!) 146/90 (!) 145/70 (!) 140/82  Pulse: 85 81 96 90  Resp: 18     Temp:      TempSrc:      SpO2:      Weight:      Height:         Dilation: 4 Effacement (%): 90 Cervical Position: Middle Station: -1 Presentation: Vertex Exam by:: Lary Eckardt MD FHT: baseline rate 135, moderate varibility, +acel, no decels Toco: IUPC, mix of regular, coupling, and intermittent contractions. MVU's are adequate (220-280)  Labs: Lab Results  Component Value Date   WBC 7.5 11/07/2022   HGB 12.9 11/07/2022   HCT 38.3 11/07/2022   MCV 83.1 11/07/2022   PLT 192 11/07/2022    Patient Active Problem List   Diagnosis Date Noted   Indication for care in labor or delivery 11/07/2022   Large for gestational age fetus affecting management of mother, antepartum, third trimester 10/20/2022   Marginal insertion of umbilical cord affecting management of mother in third trimester 07/07/2022   Supervision of normal pregnancy, antepartum 06/02/2022   BMI 40.0-44.9, adult ([place] 06/02/2022   Maternal morbid obesity, antepartum ([place] 06/02/2022    Assessment / Plan: 26y.o. G1P0 at 345w1dere for SROM.  Labor: s/p FB and misoprostol. Pitocin started at 1154, IUPC placed at 1328. Contractions have been adequate since placement of IUPC. No change since foley came out. Discussed baby is likely malpositioned and needs to turn, just need to be patient.   Fetal Wellbeing:  Cat I Pain Control:  epidural GBS: neg Anticipated MOD:  SVD  Gestational hypertension: Discussed with patient, no change in management. She is asymptomatic. Recheck labs at 24h mark  (tomorrow AM) if not delivered by then.  MaClarnce FlockMD/MPH Attending Family Medicine Physician, FaRehabilitation [facility] Of Jenningsor WoSt [facility]ullivan[facility]   11/07/2022, 4:53 PM

## 2022-11-07 NOTE — Progress Notes (Signed)
LABOR PROGRESS NOTE  Pamela Munoz is a 26 y.o. G1P0 at [redacted]w[redacted]d admitted for SROM.  Subjective: Has a bit of a hot spot on L hip/back, though overall comfortable  Objective: BP 127/78   Pulse 95   Temp 98.4 F (36.9 C) (Oral)   Resp 18   Ht '5\' 6"'$  (1.676 m)   Wt 135.4 kg   LMP 02/20/2022   SpO2 99%   BMI 48.20 kg/m  or  Vitals:   11/07/22 1932 11/07/22 2002 11/07/22 2032 11/07/22 2102  BP: (!) 157/85 (!) 144/89 (!) 127/49 127/78  Pulse: (!) 101 (!) 110 92 95  Resp:      Temp:      TempSrc:      SpO2:      Weight:      Height:         Dilation: 8 Effacement (%): 90 Cervical Position: Middle Station: -1 Presentation: Vertex Exam by:: EDione PloverMD FHT: baseline rate 145, moderate varibility, +acel, no decels Toco: IUPC, mix of regular, coupling, and intermittent contractions. MVU's are mostly adequate (190-240)  Labs: Lab Results  Component Value Date   WBC 7.5 11/07/2022   HGB 12.9 11/07/2022   HCT 38.3 11/07/2022   MCV 83.1 11/07/2022   PLT 192 11/07/2022    Patient Active Problem List   Diagnosis Date Noted   Indication for care in labor or delivery 11/07/2022   Large for gestational age fetus affecting management of mother, antepartum, third trimester 10/20/2022   Marginal insertion of umbilical cord affecting management of mother in third trimester 07/07/2022   Supervision of normal pregnancy, antepartum 06/02/2022   BMI 40.0-44.9, adult ([place] 06/02/2022   Maternal morbid obesity, antepartum ([place] 06/02/2022    Assessment / Plan: 26y.o. G1P0 at 359w1dere for SROM.  Labor: s/p FB and misoprostol. Pitocin started at 1154, IUPC placed at 1328. Patient has made cervical change though not much change in station. Continue to suspect malpositioning. Continue pitocin and frequent position changes to encourage rotation of baby.  Fetal Wellbeing:  Cat I Pain Control:  epidural GBS: neg Anticipated MOD:  SVD  Gestational hypertension: Asymptomatic. Recheck  labs at 24h mark (tomorrow AM) if not delivered by then.  MaClarnce FlockMD/MPH Attending Family Medicine Physician, Fa[facility] Hospitalor [facility]etaCoCanistotaroup   11/07/2022, 9:24 PM

## 2022-11-07 NOTE — H&P (Signed)
OBSTETRIC ADMISSION HISTORY AND PHYSICAL  Pamela Munoz is a 26 y.o. female G1P0 with IUP at 40w1dby LMP presenting for SROM. She reports +FMs, No LOF, no VB, no blurry vision, headaches or peripheral edema, and RUQ pain.  She plans on breast feeding. She declines birth control. [facility]   Dating: By LMP --->  Estimated Date of Delivery: 11/27/22  Sono:    '@[redacted]w[redacted]d'$ , CWD, normal anatomy, cephalic presentation, posterior placenta, 3114g, 97% EFW   Prenatal History/Complications: Obesity affecting pregnancy, LGA fetus   Past Medical History: Past Medical History:  Diagnosis Date   Morbid obesity with BMI of 40.0-44.9, adult ([facility]    Osteochondroma 2011   left leg    Past Surgical History: Past Surgical History:  Procedure Laterality Date   ANTERIOR CRUCIATE LIGAMENT REPAIR Right 08/03/2017   Procedure: ANTERIOR CRUCIATE LIGAMENT (ACL) REPAIR;  Surgeon: PLeim Fabry MD;  [facility]  Service: Orthopedics;  Laterality: Right;   KNEE ARTHROSCOPY Right 08/03/2017   Procedure: ARTHROSCOPY KNEERIGHT MEDIAL MENISCUS REPAIR;  Surgeon: PLeim Fabry MD;  [place]  Service: Orthopedics;  Laterality: Right;  MTF CANNULATED DOWELS- WES CONN STRYKER VERSITOMIC REAMERS +/- INJECTABLE MAP3 CMaliFRYE ARTHREX DILATORS CORING REAMERS +/- DBM ALEX ROBERTS BIOMET REMOVAL DEVICE (POSSIBLE) (UNKNOWN REP-TRYING TO FIND OUT 70 DEGREE ARTHROSCOPE   KNEE ARTHROSCOPY WITH ANTERIOR CRUCIATE LIGAMENT (ACL) REPAIR Right    KNEE SURGERY Left    bone spur   TONSILLECTOMY     WISDOM TOOTH EXTRACTION      Obstetrical History: OB History     Gravida  1   Para      Term      Preterm      AB      Living         SAB      IAB      Ectopic      Multiple      Live Births              Social History Social History   Socioeconomic History   Marital status: Married    Spouse name: MAlexyia Guarino  Number of children:  Not on file   Years of education: Not on file   Highest education level: Not on file  Occupational History   Occupation: Pharmacy Tech  Tobacco Use   Smoking status: Never    Passive exposure: Never   Smokeless tobacco: Never  Vaping Use   Vaping Use: Never used  Substance and Sexual Activity   Alcohol use: No   Drug use: No   Sexual activity: Yes    Partners: Male    Comment: Pregnant  Other Topics Concern   Not on file  Social History Narrative   ** Merged History Encounter **       Social Determinants of Health   Financial Resource Strain: Not on file  Food Insecurity: Not on file  Transportation Needs: Not on file  Physical Activity: Not on file  Stress: Not on file  Social Connections: Not on file    Family History: Family History  Problem Relation Age of Onset   Endometriosis Mother    Diabetes Maternal Grandfather     Allergies: Allergies  Allergen Reactions   Percocet [Oxycodone-Acetaminophen] Itching    Medications Prior to Admission  Medication Sig Dispense Refill Last Dose   aspirin EC 81 MG tablet Take 1 tablet (81 mg total)  by mouth daily. Swallow whole. Take 1 daily until you go into labor for Preeclampsia prophylaxis 30 tablet 12    dextromethorphan-guaiFENesin (MUCINEX DM) 30-600 MG 12hr tablet Take 1 tablet by mouth 2 (two) times daily. For cold      doxylamine, Sleep, (UNISOM) 25 MG tablet Take 25 mg by mouth at bedtime as needed for sleep.      famotidine (PEPCID) 20 MG tablet Take 1 tablet (20 mg total) by mouth 2 (two) times daily. (Patient not taking: Reported on 08/04/2022) 90 tablet 3    loratadine (CLARITIN) 10 MG tablet Take 1 tablet (10 mg total) by mouth daily. (Patient not taking: Reported on 08/04/2022) 30 tablet 0    Prenatal Vit-Fe Fumarate-FA (PRENATAL MULTIVITAMIN) TABS tablet Take 1 tablet by mouth daily at 12 noon.        Review of Systems   All systems reviewed and negative except as stated in HPI  Blood pressure 92/75,  pulse (!) 131, temperature 97.7 F (36.5 C), temperature source Oral, resp. rate 18, height '5\' 6"'$  (1.676 m), weight 135.4 kg, last menstrual period 02/20/2022, SpO2 100 %. General appearance: alert, cooperative, and no distress Lungs: Normal work of breathing  Heart: regular rate Abdomen: soft, non-tender; Pelvic: 1/60/-3 Extremities: Homans sign is negative, no sign of DVT Presentation: cephalic Fetal monitoringBaseline: 140 bpm, Variability: Good {> 6 bpm), Accelerations: Reactive, and Decelerations: Absent Uterine activityirregular      Prenatal labs: ABO, Rh: O/Positive/-- (07/20 1535) Antibody: Negative (07/20 1535) Rubella: 1.44 (07/20 1535) RPR: Non Reactive (10/18 1033)  HBsAg: Negative (07/20 1535)  HIV: Non Reactive (10/18 1033)  GBS: Negative/-- (12/20 1516)  1 hr Glucola neg Genetic screening  LR Anatomy US Normal   Prenatal Transfer Tool  Maternal Diabetes: No Genetic Screening: Normal Maternal Ultrasounds/Referrals: Normal Fetal Ultrasounds or other Referrals:  Referred to Materal Fetal Medicine  Maternal Substance Abuse:  No Significant Maternal Medications:  None Significant Maternal Lab Results:  Group B Strep negative Number of Prenatal Visits:greater than 3 verified prenatal visits Other Comments:  None  Results for orders placed or performed during the hospital encounter of 11/07/22 (from the past 24 hour(s))  POCT fern test   Collection Time: 11/07/22  5:41 AM  Result Value Ref Range   POCT Fern Test      Patient Active Problem List   Diagnosis Date Noted   Munoz for gestational age fetus affecting management of mother, antepartum, third trimester 10/20/2022   Marginal insertion of umbilical cord affecting management of mother in third trimester 07/07/2022   Supervision of normal pregnancy, antepartum 06/02/2022   BMI 40.0-44.9, adult ([place]) 06/02/2022   Maternal morbid obesity, antepartum ([place]) 06/02/2022    Assessment/Plan:  Pamela Munoz  is a 26 y.o. G1P0 at 80w1dhere for SROM.  #Labor:FB placed and 50/25 cytotec ordered  #Pain: Epidural per patient request #FWB: Cat 1 #ID:  GBS neg #MOF: Breast #MOC:Declines  #Circ:  yes  JConcepcion Living MD  11/07/2022, 5:49 AM

## 2022-11-07 NOTE — Progress Notes (Signed)
LABOR PROGRESS NOTE  Pamela Munoz is a 26 y.o. G1P0 at [redacted]w[redacted]d admitted for SROM.  Subjective: Feeling comfortable with epidural  Objective: BP (!) 141/97   Pulse 82   Temp 98.4 F (36.9 C) (Oral)   Resp 18   Ht '5\' 6"'$  (1.676 m)   Wt 135.4 kg   LMP 02/20/2022   SpO2 99%   BMI 48.20 kg/m  or  Vitals:   11/07/22 1115 11/07/22 1120 11/07/22 1125 11/07/22 1131  BP: 137/67 (!) 145/83 (!) 145/72 (!) 141/97  Pulse: 95 82 83 82  Resp:      Temp:      TempSrc:      SpO2:      Weight:      Height:         Dilation: 4 Effacement (%): 90 Cervical Position: Middle Station: -2 Presentation: Vertex Exam by:: CScientist, clinical (histocompatibility and immunogenetics)FHT: baseline rate 140, moderate varibility, +acel, no decel Toco: poor tracing  Labs: Lab Results  Component Value Date   WBC 7.5 11/07/2022   HGB 12.9 11/07/2022   HCT 38.3 11/07/2022   MCV 83.1 11/07/2022   PLT 192 11/07/2022    Patient Active Problem List   Diagnosis Date Noted   Indication for care in labor or delivery 11/07/2022   Large for gestational age fetus affecting management of mother, antepartum, third trimester 10/20/2022   Marginal insertion of umbilical cord affecting management of mother in third trimester 07/07/2022   Supervision of normal pregnancy, antepartum 06/02/2022   BMI 40.0-44.9, adult ([place] 06/02/2022   Maternal morbid obesity, antepartum ([place] 06/02/2022    Assessment / Plan: 26y.o. G1P0 at 357w1dere for SROM.  Labor: s/p FB and misoprostol, cervix ripe. Will start pitocin.  Fetal Wellbeing:  Cat I Pain Control:  epidural GBS: neg Anticipated MOD:  SVD  Gestational hypertension: confirmed now, ctm pressures  MaClarnce FlockMD/MPH Attending Family Medicine Physician, FaNorthwest [facility]elling[facility]or WoInd[facility]oscopy Centers LLCCoForestroup   11/07/2022, 11:48 AM

## 2022-11-07 NOTE — Progress Notes (Signed)
LABOR PROGRESS NOTE  Pamela Munoz is a 26 y.o. G1P0 at [redacted]w[redacted]d admitted for SROM.  Subjective: Comfortable, was taking a nap  Objective: BP 135/66   Pulse 74   Temp 98.4 F (36.9 C) (Oral)   Resp 18   Ht '5\' 6"'$  (1.676 m)   Wt 135.4 kg   LMP 02/20/2022   SpO2 99%   BMI 48.20 kg/m  or  Vitals:   11/07/22 1131 11/07/22 1200 11/07/22 1230 11/07/22 1300  BP: (!) 141/97 (!) 142/85 (!) 143/67 135/66  Pulse: 82 90 79 74  Resp:  '18 18 18  '$ Temp:      TempSrc:      SpO2:      Weight:      Height:         Dilation: 4 Effacement (%): 90 Cervical Position: Middle Station: -2 Presentation: Vertex Exam by:: Chelsea RN FHT: baseline rate 135, moderate varibility, +acel, ?late decels? Toco: poor tracing  Labs: Lab Results  Component Value Date   WBC 7.5 11/07/2022   HGB 12.9 11/07/2022   HCT 38.3 11/07/2022   MCV 83.1 11/07/2022   PLT 192 11/07/2022    Patient Active Problem List   Diagnosis Date Noted   Indication for care in labor or delivery 11/07/2022   Large for gestational age fetus affecting management of mother, antepartum, third trimester 10/20/2022   Marginal insertion of umbilical cord affecting management of mother in third trimester 07/07/2022   Supervision of normal pregnancy, antepartum 06/02/2022   BMI 40.0-44.9, adult ([place] 06/02/2022   Maternal morbid obesity, antepartum ([place] 06/02/2022    Assessment / Plan: 26y.o. G1P0 at 34w1dere for SROM.  Labor: s/p FB and misoprostol. Pitocin started at 1154, IUPC placed at 1328 due to poor toco tracing and concern for possible lates.  Fetal Wellbeing:  Cat I overall, will be able to better assess now that IUPC is in place Pain Control:  epidural GBS: neg Anticipated MOD:  SVD  Gestational hypertension: confirmed now, ctm pressures. Recheck labs at 24h mark (tomorrow AM) if not delivered by then.  MaClarnce FlockMD/MPH Attending Family Medicine Physician, FaSilver Oaks Behavorial Hospitalor WoAnna [facility] HospitalCoLake Madisonroup   11/07/2022, 1:27 PM

## 2022-11-07 NOTE — Anesthesia Preprocedure Evaluation (Signed)
Anesthesia Evaluation  Patient identified by MRN, date of birth, ID band Patient awake    Reviewed: Allergy & Precautions, Patient's Chart, lab work & pertinent test results  Airway Mallampati: II       Dental no notable dental hx.    Pulmonary neg pulmonary ROS   Pulmonary exam normal        Cardiovascular negative cardio ROS Normal cardiovascular exam Rhythm:Regular     Neuro/Psych negative neurological ROS  negative psych ROS   GI/Hepatic Neg liver ROS,GERD  Medicated,,  Endo/Other    Morbid obesity  Renal/GU negative Renal ROS  negative genitourinary   Musculoskeletal negative musculoskeletal ROS (+)    Abdominal  (+) + obese  Peds  Hematology negative hematology ROS (+)   Anesthesia Other Findings   Reproductive/Obstetrics (+) Pregnancy 37 1/7 weeks SROM                             Anesthesia Physical Anesthesia Plan  ASA: 3  Anesthesia Plan: Epidural   Post-op Pain Management: Minimal or no pain anticipated and Regional block*   Induction:   PONV Risk Score and Plan:   Airway Management Planned: Natural Airway  Additional Equipment: Fetal Monitoring  Intra-op Plan:   Post-operative Plan:   Informed Consent: I have reviewed the patients History and Physical, chart, labs and discussed the procedure including the risks, benefits and alternatives for the proposed anesthesia with the patient or authorized representative who has indicated his/her understanding and acceptance.       Plan Discussed with: Anesthesiologist  Anesthesia Plan Comments:        Anesthesia Quick Evaluation

## 2022-11-08 ENCOUNTER — Encounter (HOSPITAL_COMMUNITY): Payer: Self-pay | Admitting: Family Medicine

## 2022-11-08 DIAGNOSIS — Z3A37 37 weeks gestation of pregnancy: Secondary | ICD-10-CM

## 2022-11-08 DIAGNOSIS — O3663X0 Maternal care for excessive fetal growth, third trimester, not applicable or unspecified: Secondary | ICD-10-CM

## 2022-11-08 DIAGNOSIS — O135 Gestational [pregnancy-induced] hypertension without significant proteinuria, complicating the puerperium: Secondary | ICD-10-CM

## 2022-11-08 DIAGNOSIS — O4423 Partial placenta previa NOS or without hemorrhage, third trimester: Secondary | ICD-10-CM

## 2022-11-08 DIAGNOSIS — O4202 Full-term premature rupture of membranes, onset of labor within 24 hours of rupture: Secondary | ICD-10-CM

## 2022-11-08 DIAGNOSIS — O99214 Obesity complicating childbirth: Secondary | ICD-10-CM

## 2022-11-08 LAB — CBC
HCT: 27.8 % — ABNORMAL LOW (ref 36.0–46.0)
Hemoglobin: 9.3 g/dL — ABNORMAL LOW (ref 12.0–15.0)
MCH: 28.2 pg (ref 26.0–34.0)
MCHC: 33.5 g/dL (ref 30.0–36.0)
MCV: 84.2 fL (ref 80.0–100.0)
Platelets: 187 10*3/uL (ref 150–400)
RBC: 3.3 MIL/uL — ABNORMAL LOW (ref 3.87–5.11)
RDW: 13.8 % (ref 11.5–15.5)
WBC: 18.6 10*3/uL — ABNORMAL HIGH (ref 4.0–10.5)
nRBC: 0 % (ref 0.0–0.2)

## 2022-11-08 LAB — DIC (DISSEMINATED INTRAVASCULAR COAGULATION)PANEL
D-Dimer, Quant: 2.17 ug/mL-FEU — ABNORMAL HIGH (ref 0.00–0.50)
Fibrinogen: 381 mg/dL (ref 210–475)
INR: 1.1 (ref 0.8–1.2)
Platelets: 188 10*3/uL (ref 150–400)
Prothrombin Time: 14.5 seconds (ref 11.4–15.2)
Smear Review: NONE SEEN
aPTT: 26 seconds (ref 24–36)

## 2022-11-08 LAB — CBC WITH DIFFERENTIAL/PLATELET
Abs Immature Granulocytes: 0.06 10*3/uL (ref 0.00–0.07)
Basophils Absolute: 0 10*3/uL (ref 0.0–0.1)
Basophils Relative: 0 %
Eosinophils Absolute: 0.1 10*3/uL (ref 0.0–0.5)
Eosinophils Relative: 1 %
HCT: 31.6 % — ABNORMAL LOW (ref 36.0–46.0)
Hemoglobin: 10.9 g/dL — ABNORMAL LOW (ref 12.0–15.0)
Immature Granulocytes: 0 %
Lymphocytes Relative: 15 %
Lymphs Abs: 2 10*3/uL (ref 0.7–4.0)
MCH: 28.8 pg (ref 26.0–34.0)
MCHC: 34.5 g/dL (ref 30.0–36.0)
MCV: 83.4 fL (ref 80.0–100.0)
Monocytes Absolute: 1.1 10*3/uL — ABNORMAL HIGH (ref 0.1–1.0)
Monocytes Relative: 8 %
Neutro Abs: 10.5 10*3/uL — ABNORMAL HIGH (ref 1.7–7.7)
Neutrophils Relative %: 76 %
Platelets: 164 10*3/uL (ref 150–400)
RBC: 3.79 MIL/uL — ABNORMAL LOW (ref 3.87–5.11)
RDW: 13.8 % (ref 11.5–15.5)
WBC: 13.8 10*3/uL — ABNORMAL HIGH (ref 4.0–10.5)
nRBC: 0 % (ref 0.0–0.2)

## 2022-11-08 LAB — AMMONIA: Ammonia: <10 umol/L (ref 9–35)

## 2022-11-08 LAB — CERVICOVAGINAL ANCILLARY ONLY
Chlamydia: NEGATIVE
Comment: NEGATIVE
Comment: NORMAL
Neisseria Gonorrhea: NEGATIVE

## 2022-11-08 LAB — ABO/RH: ABO/RH(D): O POS

## 2022-11-08 LAB — PREPARE RBC (CROSSMATCH)

## 2022-11-08 MED ORDER — ONDANSETRON HCL 4 MG/2ML IJ SOLN: 4.0000 mg | INTRAMUSCULAR | Status: DC | PRN

## 2022-11-08 MED ORDER — WITCH HAZEL-GLYCERIN EX PADS
1.0000 | MEDICATED_PAD | CUTANEOUS | Status: DC | PRN
  Administered 2022-11-10: 1 via TOPICAL

## 2022-11-08 MED ORDER — DIPHENHYDRAMINE HCL 25 MG PO CAPS: 25.0000 mg | ORAL_CAPSULE | Freq: Four times a day (QID) | ORAL | Status: DC | PRN

## 2022-11-08 MED ORDER — DIBUCAINE (PERIANAL) 1 % EX OINT: 1.0000 | TOPICAL_OINTMENT | CUTANEOUS | Status: DC | PRN

## 2022-11-08 MED ORDER — MAGNESIUM HYDROXIDE 400 MG/5ML PO SUSP: 30.0000 mL | ORAL | Status: DC | PRN

## 2022-11-08 MED ORDER — IBUPROFEN 600 MG PO TABS
600.0000 mg | ORAL_TABLET | Freq: Four times a day (QID) | ORAL | Status: DC | PRN
  Administered 2022-11-08: 600 mg via ORAL
  Filled 2022-11-08: qty 1

## 2022-11-08 MED ORDER — LACTATED RINGERS IV BOLUS
1000.0000 mL | Freq: Once | INTRAVENOUS | Status: AC
  Administered 2022-11-08: 1000 mL via INTRAVENOUS

## 2022-11-08 MED ORDER — IBUPROFEN 600 MG PO TABS
600.0000 mg | ORAL_TABLET | Freq: Four times a day (QID) | ORAL | Status: DC
  Administered 2022-11-08 – 2022-11-10 (×7): 600 mg via ORAL
  Filled 2022-11-08 (×7): qty 1

## 2022-11-08 MED ORDER — AMMONIA AROMATIC IN INHA
RESPIRATORY_TRACT | Status: AC
  Filled 2022-11-08: qty 10

## 2022-11-08 MED ORDER — BENZOCAINE-MENTHOL 20-0.5 % EX AERO
1.0000 | INHALATION_SPRAY | CUTANEOUS | Status: DC | PRN
  Administered 2022-11-10: 1 via TOPICAL
  Filled 2022-11-08 (×2): qty 56

## 2022-11-08 MED ORDER — SIMETHICONE 80 MG PO CHEW: 80.0000 mg | CHEWABLE_TABLET | ORAL | Status: DC | PRN

## 2022-11-08 MED ORDER — FENTANYL CITRATE (PF) 100 MCG/2ML IJ SOLN
INTRAMUSCULAR | Status: AC
  Filled 2022-11-08: qty 2

## 2022-11-08 MED ORDER — ONDANSETRON HCL 4 MG PO TABS: 4.0000 mg | ORAL_TABLET | ORAL | Status: DC | PRN

## 2022-11-08 MED ORDER — OXYCODONE HCL 5 MG PO TABS: 5.0000 mg | ORAL_TABLET | ORAL | Status: DC | PRN

## 2022-11-08 MED ORDER — TRANEXAMIC ACID-NACL 1000-0.7 MG/100ML-% IV SOLN
INTRAVENOUS | Status: AC
  Administered 2022-11-08: 1000 mg via INTRAVENOUS
  Filled 2022-11-08: qty 100

## 2022-11-08 MED ORDER — PRENATAL MULTIVITAMIN CH
1.0000 | ORAL_TABLET | Freq: Every day | ORAL | Status: DC
  Administered 2022-11-08 – 2022-11-09 (×2): 1 via ORAL
  Filled 2022-11-08 (×2): qty 1

## 2022-11-08 MED ORDER — SODIUM CHLORIDE 0.9% IV SOLUTION: Freq: Once | INTRAVENOUS | Status: DC

## 2022-11-08 MED ORDER — FENTANYL CITRATE (PF) 100 MCG/2ML IJ SOLN: 50.0000 ug | INTRAMUSCULAR | Status: DC | PRN

## 2022-11-08 MED ORDER — LACTATED RINGERS IV SOLN: INTRAVENOUS | Status: DC

## 2022-11-08 MED ORDER — COCONUT OIL OIL: 1.0000 | TOPICAL_OIL | Status: DC | PRN

## 2022-11-08 MED ORDER — TRANEXAMIC ACID-NACL 1000-0.7 MG/100ML-% IV SOLN: 1000.0000 mg | Freq: Once | INTRAVENOUS | Status: AC

## 2022-11-08 MED ORDER — OXYCODONE HCL 5 MG PO TABS: 10.0000 mg | ORAL_TABLET | ORAL | Status: DC | PRN

## 2022-11-08 MED ORDER — SENNOSIDES-DOCUSATE SODIUM 8.6-50 MG PO TABS
2.0000 | ORAL_TABLET | ORAL | Status: DC
  Administered 2022-11-08 – 2022-11-10 (×3): 2 via ORAL
  Filled 2022-11-08 (×3): qty 2

## 2022-11-08 NOTE — Progress Notes (Signed)
After delivery patient became hypotensive and felt like she was going to pass out. She was given an additional fluid bolus and labs were sent. Hypotension improved with supine positioning. Given high QBL and symptomatic anemia will give 2u pRBC. Discussed with patient and consent obtained, reviewed risks of transufsion reactions etc. She is in agreement.

## 2022-11-08 NOTE — Discharge Summary (Signed)
Postpartum Discharge Summary  Date of Service updated***     Patient Name: Pamela Munoz DOB: 09-30-1996 MRN: [id number]  Date of admission: 11/07/2022 Delivery date:11/08/2022  Delivering provider: Clarnce Flock  Date of discharge: 11/08/2022  Admitting diagnosis: Indication for care in labor or delivery [O75.9] Intrauterine pregnancy: [redacted]w[redacted]d    Secondary diagnosis:  Principal Problem:   Indication for care in labor or delivery Active Problems:   Maternal morbid obesity, antepartum ([place]   Marginal insertion of umbilical cord affecting management of mother in third trimester   OB Hemorrhage  Additional problems: ***    Discharge diagnosis: Term Pregnancy Delivered, Gestational Hypertension, Anemia, and PPH                                              Post partum procedures:blood transfusion Augmentation: Pitocin, Cytotec, and IP Foley Complications: [id number] Hospital course: Onset of Labor With Vaginal Delivery      26y.o. yo G1P0 at 372w2das admitted in Latent Labor on 11/07/2022. Labor course was complicated by: initially 1 cm, given miso and a FB and was 4. Started on pitocin with slow change at first. About 20 hours after SROM she was complete and pushed for about 15 minutes.   Membrane Rupture Time/Date: 4:00 AM ,11/07/2022   Delivery Method:Vaginal, Spontaneous  Episiotomy: None  Lacerations:  2nd degree  Patient had a postpartum course complicated by OB hemorrhage due to significant bleeding from her complex lacerations. She received 2u pRBC. Hgb on day of discharge was ***. She was also diagnosed with gestational HTN intrapartum, postpartum she was ***.  She is ambulating, tolerating a regular diet, passing flatus, and urinating well. Patient is discharged home in stable condition on 11/08/22.  Newborn Data: Birth date:11/08/2022  Birth time:12:57 AM  Gender:Female  Living status:Living  Apgars:9 ,9  Weight:3440 g   Magnesium Sulfate received:  No BMZ received: No Rhophylac:N/A MMR:N/A T-DaP:Given prenatally Flu: Declined prenatally Transfusion:{Transfusion received:30440034}  Physical exam  Vitals:   11/07/22 2232 11/07/22 2302 11/07/22 2332 11/08/22 0000  BP: (!) 157/94 (!) 153/66 (!) 152/67 (!) 143/96  Pulse: 95 93 100 (!) 114  Resp:      Temp:      TempSrc:      SpO2:      Weight:      Height:       General: {Exam; general:21111117} Lochia: {Desc; appropriate/inappropriate:30686::"appropriate"} Uterine Fundus: {Desc; firm/soft:30687} Incision: {Exam; incision:21111123} DVT Evaluation: {Exam; dvt:2111122} Labs: Lab Results  Component Value Date   WBC 7.5 11/07/2022   HGB 12.9 11/07/2022   HCT 38.3 11/07/2022   MCV 83.1 11/07/2022   PLT 192 11/07/2022      Latest Ref Rng & Units 11/07/2022    7:25 AM  CMP  Glucose 70 - 99 mg/dL 102   BUN 6 - 20 mg/dL 5   Creatinine 0.44 - 1.00 mg/dL 0.58   Sodium 135 - 145 mmol/L 137   Potassium 3.5 - 5.1 mmol/L 4.0   Chloride 98 - 111 mmol/L 109   CO2 22 - 32 mmol/L 18   Calcium 8.9 - 10.3 mg/dL 9.2   Total Protein 6.5 - 8.1 g/dL 5.8   Total Bilirubin 0.3 - 1.2 mg/dL 0.5   Alkaline Phos 38 - 126 U/L 143   AST 15 - 41 U/L 23   ALT 0 -  44 U/L 16    Edinburgh Score:     No data to display           After visit meds:  Allergies as of 11/08/2022       Reactions   Percocet [oxycodone-acetaminophen] Itching     Med Rec must be completed prior to using this [facility]***        Discharge home in stable condition Infant Feeding: Breast Infant Disposition:home with mother Discharge instruction: per After Visit Summary and Postpartum booklet. Activity: Advance as tolerated. Pelvic rest for 6 weeks.  Diet: routine diet Future Appointments: Future Appointments  Date Time Provider [place]  11/10/2022 10:00 AM [facility] NST [facility] None  11/10/2022  2:30 PM Darlina Rumpf, [place] [facility]  11/16/2022  2:30 PM Donnamae Jude,  MD [facility]  11/17/2022 11:30 AM [facility] US1 [facility] [place]  11/23/2022  2:30 PM Donnamae Jude, MD [facility]  11/30/2022  2:30 PM Donnamae Jude, MD [facility]   Follow up Visit:   Please schedule this patient for a In person postpartum visit in 4 weeks with the following provider: Any provider. Additional Postpartum F/U:BP check 1 week  Low risk pregnancy complicated by:  [facility] Delivery mode:  Vaginal, Spontaneous  Anticipated Birth Control:   None   11/08/2022 Clarnce Flock, MD

## 2022-11-08 NOTE — Lactation Note (Signed)
This note was copied from a baby's chart. Lactation Consultation Note  Patient Name: Pamela Munoz Date: 11/08/2022   Age:25 hours   LC Note:  Spoke with L&D RN: mother has remained in the labor room due to receiving a blood transfusion.  RN assisted with breast feeding; mother weak.  RN will call if a lactation visit is needed prior to being transferred to [facility].     Maternal Data    Feeding    LATCH Score                    Lactation Tools Discussed/Used    Interventions    Discharge    Consult Status      Saramarie Stinger R Shakevia Sarris 11/08/2022, 4:21 AM

## 2022-11-08 NOTE — Anesthesia Postprocedure Evaluation (Signed)
Anesthesia Post Note  Patient: Pamela Munoz  Procedure(s) Performed: AN AD [place]     Patient location during evaluation: Mother Baby Anesthesia Type: Epidural Level of consciousness: awake, oriented, awake and alert and patient cooperative Pain management: pain level controlled Vital Signs Assessment: post-procedure vital signs reviewed and stable Respiratory status: spontaneous breathing Cardiovascular status: blood pressure returned to baseline Postop Assessment: no headache, no backache, no apparent nausea or vomiting, able to ambulate and adequate PO intake Anesthetic complications: no   No notable events documented.  Last Vitals:  Vitals:   11/08/22 1300 11/08/22 1726  BP: 130/64 124/61  Pulse: 95 92  Resp: 16 16  Temp: 36.6 C 36.6 C  SpO2: 100% 99%    Last Pain:  Vitals:   11/08/22 1835  TempSrc:   PainSc: 4    Pain Goal:                   Kathie Rhodes

## 2022-11-08 NOTE — Lactation Note (Addendum)
This note was copied from a baby's chart. Lactation Consultation Note  Patient Name: Pamela Munoz [id number] Date: 11/08/2022 Reason for consult: Initial assessment;Early term 37-38.6wks;1st time breastfeeding (Birth Parent had [place] with 2065 ml blood loss.) See Birth Parent's MR- hx GHTN Age:25 hours Per Birth Parent,  she feels breastfeeding is going well, she only feels tug with latch and no pain, infant has breastfeed 4 times since Birth : 0705, 67 pm, 14 pm and currently, Birth Parent will continue to BF infant by cues, on demand, 8 to 12+ times within 24 hours, STS most feedings are 15 minutes or longer in length. Infant had 3 stools since birth. Birth Parent was set up with DEBP due [place] and will continue to use DEBP every 3 hours for 15 minutes on initial setting and Birth Parent will give infant back any EBM that is expressed. Birth Parent latched infant for 15 minutes mom on the  right breast using the craddle hold and 10 minutes on the left breast using the football hold position. Birth Parent was using the DEBP when [place] left the room. LC demonstrated hand expression with breast model and Birth Parent self expressed few drops of colostrum that Family member spoon fed to infant. Birth Parent knows to call RN/LC if their are any BF questions, concerns or if Birth Parent needs further latch assistance. LC discussed infants I&O. Mom made aware of O/P services, breastfeeding support groups, community resources, and our phone # for post-discharge questions.   Maternal Data Has patient been taught Hand Expression?: Yes Does the patient have breastfeeding experience prior to this delivery?: No  Feeding Mother's Current Feeding Choice: Breast Milk  LATCH Score Latch: Grasps breast easily, tongue down, lips flanged, rhythmical sucking.  Audible Swallowing: Spontaneous and intermittent  Type of Nipple: Everted at rest and after stimulation  Comfort (Breast/Nipple): Soft / non-tender  Hold  (Positioning): Assistance needed to correctly position infant at breast and maintain latch.  LATCH Score: 9   Lactation Tools Discussed/Used Tools: Pump Breast pump type: Double-Electric Breast Pump Pump Education: Setup, frequency, and cleaning;Milk Storage Reason for Pumping: Birth Parent had [place] and infant is ETI Pumping frequency: Birth Parent will continue to use DEBP every 3 hours for 15 minutes on inital setting.  Interventions Interventions: Breast feeding basics reviewed;Adjust position;Assisted with latch;Support pillows;DEBP;Skin to skin;Position options;Education;Hand express;Breast compression;LC Services brochure  Discharge Pump: DEBP;Hands Free (Per Birth Parent she has Mom Cozy DEBP at home.)  Consult Status Consult Status: Follow-up Date: 11/09/22 Follow-up type: In-patient    Eulis Canner 11/08/2022, 4:59 PM

## 2022-11-09 LAB — TYPE AND SCREEN
ABO/RH(D): O POS
Antibody Screen: NEGATIVE
Unit division: 0
Unit division: 0

## 2022-11-09 LAB — BPAM RBC
Blood Product Expiration Date: 202401202359
Blood Product Expiration Date: 202401202359
ISSUE DATE / TIME: 202312260339
ISSUE DATE / TIME: 202312260631
Unit Type and Rh: 5100
Unit Type and Rh: 5100

## 2022-11-09 MED ORDER — FERROUS SULFATE 325 (65 FE) MG PO TABS
325.0000 mg | ORAL_TABLET | ORAL | Status: DC
  Administered 2022-11-09: 325 mg via ORAL
  Filled 2022-11-09: qty 1

## 2022-11-09 NOTE — Lactation Note (Signed)
This note was copied from a baby's chart. Lactation Consultation Note  Patient Name: Pamela Munoz [id number] Date: 11/09/2022 Reason for consult: Follow-up assessment;1st time breastfeeding;Early term 37-38.6wks Age:25 hours  P1, [redacted]w[redacted]d  6.9% in 24 hours.  Mother getting ready to take a nap. Declined needing assistance at this time.  Baby recently breastfed for 20 min.  Lactation to follow up after mother rests.  Feeding Mother's Current Feeding Choice: Breast Milk  Lactation Tools Discussed/Used Tools: Pump Breast pump type: Double-Electric Breast Pump  Interventions Interventions: Education  Discharge Pump: Personal;DEBP  Consult Status Consult Status: Follow-up Date: 11/09/22 Follow-up type: In-patient    BVivianne MasterBMarshfield Clinic Inc12/27/2023, 10:05 AM

## 2022-11-09 NOTE — Lactation Note (Addendum)
This note was copied from a baby's chart. Lactation Consultation Note  Patient Name: Pamela Munoz Staff [id number] Date: 11/09/2022 Reason for consult: Follow-up assessment;Early term 37-38.6wks;1st time breastfeeding;Infant weight loss (-7% weight loss) Age:25 hours Current feeding preference is breastfeeding and donor breast milk.  P1, ETI female infant was circumcised earlier today. Infant had 7 stools and 2 void diapers. Birth Parent decided to supplement infant with donor breast milk. LC did not observe latch infant recently BF for 40 minutes at 1400 pm and then BF again for 10 minutes at 1530 pm.  Infant was supplemented with 10 mls of donor breast milk by Support Person using slow flow bottle nipple while Birth Parent was using the DEBP as LC left the room. LC discussed with Birth Parent to BF infant 8+ times within 24 hours, to reserve infant energy and help stabilize infant's weight loss. Birth Parent will limit chest feeding to 20 minutes and afterwards supplement infant with her EBM first and then donor breast milk day 2 breastfeeding and supplemental sheet will offer 7-12 mls or more if infant wants it. Birth Parent will continue to use DEBP every 3 hours for 15 minutes on initial setting and give infant back any EBM first before formula. Birth Parent understands that EBM is safe at room temperature for 4 hours whereas donor breast milk must be used within 1 hour. Birth Parent knows to call [facility] if their are any BF questions, concerns of if latch assistance is needed.  Maternal Data    Feeding Mother's Current Feeding Choice: Breast Milk and Donor Milk  LATCH Score  LC did not observe latch infant had finished breastfeeding prior to [facility] entering the room.                  Lactation Tools Discussed/Used    Interventions Interventions: Pace feeding;Education;DEBP;Skin to skin  Discharge    Consult Status Consult Status: Follow-up Date: 11/10/22 Follow-up type:  In-patient    Eulis Canner 11/09/2022, 4:15 PM

## 2022-11-09 NOTE — Progress Notes (Signed)
POSTPARTUM PROGRESS NOTE  Post Partum Day 1  Subjective:  LEATHA ROHNER is a 26 y.o. G1P1001 s/p VD at [redacted]w[redacted]d  She reports she is doing well. No acute events overnight. She denies any problems with ambulating, voiding or po intake. Denies nausea or vomiting.  Pain is well controlled.  Lochia is minimal. She does not feel dizzy or lightheaded.  Objective: Blood pressure 132/71, pulse 86, temperature 97.8 F (36.6 C), temperature source Oral, resp. rate 18, height '5\' 6"'$  (1.676 m), weight 135.4 kg, last menstrual period 02/20/2022, SpO2 99 %, unknown if currently breastfeeding.  Physical Exam:  General: alert, cooperative and no distress Chest: no respiratory distress Heart:regular rate, distal pulses intact Abdomen: soft, nontender,  Uterine Fundus: firm, appropriately tender DVT Evaluation: No calf swelling or tenderness Extremities: trace edema b/l Skin: warm, dry  Recent Labs    11/08/22 0324 11/08/22 1404  HGB 9.3* 10.9*  HCT 27.8* 31.6*    Assessment/Plan: ODEJANE SCHEIBEis a 26y.o. G1P1001 s/p VD at 371w2d PPD#1 - Doing well  Routine postpartum care Acute blood loss Anemia: s/p 2 units pRBC, given PPH and pre-syncopal symptoms. Doing well now. Post transfusion hgb 10.9.  - will continue with PO iron. Contraception: declines Feeding: breast Dispo: Plan for discharge tomorrow.   LOS: 2 days   ChLiliane ChannelD MPH OB Fellow, [facility]2/27/2023

## 2022-11-10 ENCOUNTER — Other Ambulatory Visit (HOSPITAL_COMMUNITY): Payer: Self-pay

## 2022-11-10 ENCOUNTER — Other Ambulatory Visit: Payer: BC Managed Care – PPO

## 2022-11-10 ENCOUNTER — Encounter: Payer: BC Managed Care – PPO | Admitting: Advanced Practice Midwife

## 2022-11-10 MED ORDER — FUROSEMIDE 20 MG PO TABS
20.0000 mg | ORAL_TABLET | Freq: Two times a day (BID) | ORAL | Status: DC
  Administered 2022-11-10: 20 mg via ORAL
  Filled 2022-11-10: qty 1

## 2022-11-10 MED ORDER — FUROSEMIDE 20 MG PO TABS
20.0000 mg | ORAL_TABLET | Freq: Two times a day (BID) | ORAL | 0 refills | Status: DC
  Filled 2022-11-10: qty 10, 5d supply, fill #0

## 2022-11-10 MED ORDER — WITCH HAZEL-GLYCERIN EX PADS
1.0000 | MEDICATED_PAD | CUTANEOUS | 12 refills | Status: DC | PRN
  Filled 2022-11-10: qty 40, fill #0

## 2022-11-10 MED ORDER — SENNOSIDES-DOCUSATE SODIUM 8.6-50 MG PO TABS
2.0000 | ORAL_TABLET | ORAL | 0 refills | Status: DC
  Filled 2022-11-10: qty 30, 15d supply, fill #0

## 2022-11-10 MED ORDER — IBUPROFEN 600 MG PO TABS
600.0000 mg | ORAL_TABLET | Freq: Four times a day (QID) | ORAL | 0 refills | Status: DC
  Filled 2022-11-10: qty 30, 8d supply, fill #0

## 2022-11-10 MED ORDER — BENZOCAINE-MENTHOL 20-0.5 % EX AERO
1.0000 | INHALATION_SPRAY | CUTANEOUS | 0 refills | Status: DC | PRN
  Filled 2022-11-10: qty 78, fill #0

## 2022-11-10 MED ORDER — NIFEDIPINE ER 30 MG PO TB24
30.0000 mg | ORAL_TABLET | Freq: Every day | ORAL | 0 refills | Status: DC
  Filled 2022-11-10: qty 30, 30d supply, fill #0

## 2022-11-10 MED ORDER — NIFEDIPINE ER OSMOTIC RELEASE 30 MG PO TB24
30.0000 mg | ORAL_TABLET | Freq: Every day | ORAL | Status: DC
  Administered 2022-11-10: 30 mg via ORAL
  Filled 2022-11-10: qty 1

## 2022-11-10 NOTE — Lactation Note (Signed)
This note was copied from a baby's chart. Lactation Consultation Note  Patient Name: Pamela Munoz [id number] Date: 11/10/2022 Reason for consult: Follow-up assessment;Early term 37-38.6wks;1st time breastfeeding;Infant weight loss Age:25 hours  P1, [redacted]w[redacted]d  8.2% weight loss.  1.3% in the last 24 hours. Mother was supplementing with donor milk and now if supplementing with formula.  Discussed pumping to increase mother's milk supply.   Feed on demand with cues.  Goal 8-12+ times per day after first 24 hrs.  Place baby STS if not cueing.  Reviewed engorgement care and monitoring voids/stools.   Maternal Data Has patient been taught Hand Expression?: Yes Does the patient have breastfeeding experience prior to this delivery?: No  Feeding Mother's Current Feeding Choice: Breast Milk and Formula  Lactation Tools Discussed/Used Tools: Pump Breast pump type: Double-Electric Breast Pump Pump Education: Setup, frequency, and cleaning;Milk Storage Pumping frequency:  (every other feeding)  Interventions Interventions: Education;DEBP  Discharge Discharge Education: Engorgement and breast care;Warning signs for feeding baby Pump: Personal;Hands Free  Consult Status Consult Status: Complete Date: 11/10/22    BVivianne MasterBSahara Outpatient Surgery Center Ltd12/28/2023, 10:21 AM

## 2022-11-16 ENCOUNTER — Encounter: Payer: BC Managed Care – PPO | Admitting: Family Medicine

## 2022-11-16 ENCOUNTER — Ambulatory Visit (INDEPENDENT_AMBULATORY_CARE_PROVIDER_SITE_OTHER): Payer: BC Managed Care – PPO | Admitting: *Deleted

## 2022-11-16 NOTE — Progress Notes (Signed)
Subjective:  Pamela Munoz is a 27 y.o. female here for BP check.   Hypertension ROS: Patient denies any headaches, visual symptoms, RUQ/epigastric pain or other concerning symptoms.  Objective:  BP 127/86   Pulse 94   Appearance alert, well appearing, and in no distress. General exam BP noted to be well controlled today in office.    Assessment:   Blood Pressure well controlled.   Plan:  Follow up as needed and at postpartum visit  Crosby Oyster, RN .

## 2022-11-17 ENCOUNTER — Telehealth: Payer: Self-pay | Admitting: *Deleted

## 2022-11-17 ENCOUNTER — Other Ambulatory Visit: Payer: BC Managed Care – PPO

## 2022-11-17 NOTE — Telephone Encounter (Signed)
Returned call form pt husband and spoke to pt. Pt states she has had this feeling of lightheaded ness almost passing out feeling. Denies any other symptoms. Pt is currently taking procardia '30mg'$  daily. Pt states she takes med around 9-10am and then a few hours alter feels the lightheadedness. Will have pt family pick up a BP cuff from the office for pt to check BP, and if on low side to call me back. Also advised pt to not take BP med tomorrow and see if she doesn't feel that feeling and then to let me know. If BP is normal and doesn't seem to be a side effect from the medicine then we may have her come in to check CBC or send to MAU. Pt verbalizes and understnds

## 2022-11-23 ENCOUNTER — Encounter: Payer: BC Managed Care – PPO | Admitting: Family Medicine

## 2022-11-30 ENCOUNTER — Encounter: Payer: BC Managed Care – PPO | Admitting: Family Medicine

## 2022-12-20 ENCOUNTER — Encounter: Payer: Self-pay | Admitting: Obstetrics and Gynecology

## 2022-12-20 ENCOUNTER — Ambulatory Visit (INDEPENDENT_AMBULATORY_CARE_PROVIDER_SITE_OTHER): Payer: BC Managed Care – PPO | Admitting: Obstetrics and Gynecology

## 2022-12-20 DIAGNOSIS — O133 Gestational [pregnancy-induced] hypertension without significant proteinuria, third trimester: Secondary | ICD-10-CM | POA: Insufficient documentation

## 2022-12-20 DIAGNOSIS — Z8759 Personal history of other complications of pregnancy, childbirth and the puerperium: Secondary | ICD-10-CM

## 2022-12-20 NOTE — Progress Notes (Signed)
    [facility] Partum Visit Note  Pamela Munoz is a 27 y.o. G1P1001 s/p 12/26 SVD/2nd degree at 37wks after presenting with SROM. Patient had immediate PPH and diagnosed with GHTN in labor. Anesthesia: epidural. Postpartum course has been good. Baby is doing well. Baby is feeding by both breast and bottle - Enfamil Gentle-ease . Bleeding no bleeding. Bowel function is normal. Bladder function is normal. Patient is sexually active. Contraception method is none. Postpartum depression screening: none  No period yet, pt is emptying her breast q2-3 hours.   Review of Systems Pertinent items noted in HPI and remainder of comprehensive ROS otherwise negative.  Objective:  BP 101/71   Pulse (!) 51   Wt 270 lb (122.5 kg)   BMI 43.58 kg/m    NAD EGBUS, introitus wnl Assessment:   Normal PP exam  Plan:  Routine care. Declines anything for contraception.   Diagnosis  Date Value Ref Range Status  06/02/2022   Final   - Negative for intraepithelial lesion or malignancy (NILM)   Aletha Halim, MD [facility]

## 2023-08-21 ENCOUNTER — Emergency Department: Payer: BC Managed Care – PPO

## 2023-08-21 ENCOUNTER — Other Ambulatory Visit: Payer: Self-pay

## 2023-08-21 ENCOUNTER — Emergency Department
Admission: EM | Admit: 2023-08-21 | Discharge: 2023-08-21 | Disposition: A | Discharge status: Home / Self Care | Payer: BC Managed Care – PPO | Attending: Emergency Medicine | Admitting: Emergency Medicine

## 2023-08-21 DIAGNOSIS — Z3A01 Less than 8 weeks gestation of pregnancy: Secondary | ICD-10-CM | POA: Diagnosis not present

## 2023-08-21 DIAGNOSIS — O3481 Maternal care for other abnormalities of pelvic organs, first trimester: Secondary | ICD-10-CM | POA: Diagnosis not present

## 2023-08-21 DIAGNOSIS — O034 Incomplete spontaneous abortion without complication: Secondary | ICD-10-CM | POA: Insufficient documentation

## 2023-08-21 DIAGNOSIS — O209 Hemorrhage in early pregnancy, unspecified: Secondary | ICD-10-CM | POA: Diagnosis not present

## 2023-08-21 DIAGNOSIS — O368310 Maternal care for abnormalities of the fetal heart rate or rhythm, first trimester, not applicable or unspecified: Secondary | ICD-10-CM | POA: Diagnosis not present

## 2023-08-21 LAB — CBC
HCT: 37.3 % (ref 36.0–46.0)
Hemoglobin: 11.9 g/dL — ABNORMAL LOW (ref 12.0–15.0)
MCH: 25 pg — ABNORMAL LOW (ref 26.0–34.0)
MCHC: 31.9 g/dL (ref 30.0–36.0)
MCV: 78.4 fL — ABNORMAL LOW (ref 80.0–100.0)
Platelets: 227 10*3/uL (ref 150–400)
RBC: 4.76 MIL/uL (ref 3.87–5.11)
RDW: 16 % — ABNORMAL HIGH (ref 11.5–15.5)
WBC: 4.5 10*3/uL (ref 4.0–10.5)
nRBC: 0 % (ref 0.0–0.2)

## 2023-08-21 LAB — POC URINE PREG, ED: Preg Test, Ur: POSITIVE — AB

## 2023-08-21 LAB — HCG, QUANTITATIVE, PREGNANCY: hCG, Beta Chain, Quant, S: 2347 m[IU]/mL — ABNORMAL HIGH (ref <5)

## 2023-08-21 NOTE — ED Provider Notes (Signed)
[facility] Provider Note   Event Date/Time   First MD Initiated Contact with Patient 08/21/23 1314     (approximate) History  Vaginal Bleeding  HPI Pamela Munoz is a 27 y.o. female with a past medical history of morbid obesity who presents complaining of vaginal bleeding that started on Saturday.  Patient states that it has been continuous and with some clots.  Patient states that she has some aching in the suprapubic region and has been wearing only 1 pad per day.  Patient states that she is [redacted] weeks pregnant with her second pregnancy but has not had her first appointment with OB/GYN yet. ROS: Patient currently denies any vision changes, tinnitus, difficulty speaking, facial droop, sore throat, chest pain, shortness of breath, nausea/vomiting/diarrhea, dysuria, or weakness/numbness/paresthesias in any extremity   Physical Exam  Triage Vital Signs: ED Triage Vitals  Encounter Vitals Group     BP 08/21/23 1146 128/77     Systolic BP Percentile --      Diastolic BP Percentile --      Pulse Rate 08/21/23 1146 71     Resp 08/21/23 1146 17     Temp 08/21/23 1148 98.2 F (36.8 C)     Temp Source 08/21/23 1146 Oral     SpO2 08/21/23 1146 100 %     Weight 08/21/23 1147 260 lb (117.9 kg)     Height 08/21/23 1147 5\' 6"  (1.676 m)     Head Circumference --      Peak Flow --      Pain Score 08/21/23 1143 3     Pain Loc --      Pain Education --      Exclude from Growth Chart --    Most recent vital signs: Vitals:   08/21/23 1146 08/21/23 1148  BP: 128/77 128/77  Pulse: 71   Resp: 17 17  Temp:  98.2 F (36.8 C)  SpO2: 100% 100%   General: Awake, oriented x4. CV:  Good peripheral perfusion.  Resp:  Normal effort.  Abd:  No distention.  Other:  Morbidly obese Caucasian female resting comfortably in no acute distress ED Results / Procedures / Treatments  Labs (all labs ordered are listed, but only abnormal results are displayed) Labs Reviewed  CBC -  Abnormal; Notable for the following components:      Result Value   Hemoglobin 11.9 (*)    MCV 78.4 (*)    MCH 25.0 (*)    RDW 16.0 (*)    All other components within normal limits  HCG, QUANTITATIVE, PREGNANCY - Abnormal; Notable for the following components:   hCG, Beta Chain, Quant, S 2,347 (*)    All other components within normal limits  POC URINE PREG, ED - Abnormal; Notable for the following components:   Preg Test, Ur POSITIVE (*)    All other components within normal limits   RADIOLOGY ED MD interpretation:  Intrauterine gestational sac containing a yolk sac and fetal pole  located in the lower uterine segment. Crown-rump length is 4.2 mm  absent fetal heart tone  Finding interpreted independently by me and is positive for likely inevitable abortion -Agree with radiology assessment Official radiology report(s): No results found. PROCEDURES: Critical Care performed: No .1-3 Lead EKG Interpretation  Performed by: Merwyn Katos, MD Authorized by: Merwyn Katos, MD     Interpretation: normal     ECG rate:  71   ECG rate assessment: normal  Rhythm: sinus rhythm     Ectopy: none     Conduction: normal    MEDICATIONS ORDERED IN ED: Medications - No data to display IMPRESSION / MDM / ASSESSMENT AND PLAN / ED COURSE  I reviewed the triage vital signs and the nursing notes.                             The patient is on the cardiac monitor to evaluate for evidence of arrhythmia and/or significant heart rate changes. Patient's presentation is most consistent with acute presentation with potential threat to life or bodily function. Workup: CBC, BMP, UA, bHCG, Type&Screen, 1st Trimester Ultrasound  Based on History, Exam, and ED Workup patient's presentation not consistent with ectopic pregnancy, molar pregnancy, life-threatening coagulopathy, trauma, serious bacterial infection, central process or other emergency. Ultrasound shows evidence of likely inevitable  abortion given negative fetal heart tones.  Patient counseled on what to expect as well as arranging follow-up with OB/GYN for repeat hCG testing.  Patient expresses understanding and all questions were answered prior to discharge Disposition: Will discharge home with return precautions and instruction for prompt OBGYN follow up.   FINAL CLINICAL IMPRESSION(S) / ED DIAGNOSES   Final diagnoses:  Incomplete inevitable abortion   Rx / DC Orders   ED Discharge Orders     None      Note:  This document was prepared using Dragon voice recognition software and may include unintentional dictation errors.   Merwyn Katos, MD 08/22/23 [id number]

## 2023-08-21 NOTE — ED Triage Notes (Signed)
Pt comes with c/o vaginal bleeding that stared on Saturday. Pt states it has been continuous and with some clots. Pt states little achy and wearing 1 pad a day. Pt states this is 2nd pregnancy  Pt states she is about 8 weeks and has not had 1st appt yet.

## 2023-08-21 NOTE — ED Notes (Signed)
See triage note  States she is approx 6-8 weeks preg  Developed some vaginal bleeding 2 days ago  States bleeding has gotten heavier  and small clots

## 2023-08-29 ENCOUNTER — Ambulatory Visit: Payer: BC Managed Care – PPO | Admitting: *Deleted

## 2023-08-29 ENCOUNTER — Other Ambulatory Visit (INDEPENDENT_AMBULATORY_CARE_PROVIDER_SITE_OTHER): Payer: BC Managed Care – PPO

## 2023-08-29 VITALS — BP 123/84 | HR 82

## 2023-08-29 DIAGNOSIS — O039 Complete or unspecified spontaneous abortion without complication: Secondary | ICD-10-CM

## 2023-08-29 DIAGNOSIS — Z3A01 Less than 8 weeks gestation of pregnancy: Secondary | ICD-10-CM

## 2023-08-29 NOTE — Progress Notes (Signed)
Pt here for a follow up US from ED visit on 08/21/23. Pt hasn't had any bleeding since Saturday.   Patient informed that the ultrasound is considered a limited obstetric ultrasound and is not intended to be a complete ultrasound exam.  Patient also informed that the ultrasound is not being completed with the intent of assessing for fetal or placental anomalies or any pelvic abnormalities. Explained that the purpose of today's ultrasound is to assess for fetal heart rate.  Patient acknowledges the purpose of the exam and the limitations of the study.

## 2023-08-30 NOTE — Progress Notes (Signed)
Patient was assessed and managed by nursing staff during this encounter. I have reviewed the chart and agree with the documentation and plan. I have also made any necessary editorial changes.  U/s shows completed SAB. Condolences  given to patient. Rh pos  [place], MD 08/30/2023 10:20 AM

## 2023-09-19 ENCOUNTER — Encounter: Payer: BC Managed Care – PPO | Admitting: Obstetrics & Gynecology

## 2023-10-08 ENCOUNTER — Encounter: Payer: Self-pay | Admitting: *Deleted

## 2023-10-08 ENCOUNTER — Other Ambulatory Visit: Payer: Self-pay

## 2023-10-08 ENCOUNTER — Emergency Department: Payer: BC Managed Care – PPO

## 2023-10-08 DIAGNOSIS — X509XXA Other and unspecified overexertion or strenuous movements or postures, initial encounter: Secondary | ICD-10-CM | POA: Insufficient documentation

## 2023-10-08 DIAGNOSIS — M25562 Pain in left knee: Secondary | ICD-10-CM | POA: Diagnosis not present

## 2023-10-08 DIAGNOSIS — S8992XA Unspecified injury of left lower leg, initial encounter: Secondary | ICD-10-CM | POA: Diagnosis not present

## 2023-10-08 DIAGNOSIS — Z3A01 Less than 8 weeks gestation of pregnancy: Secondary | ICD-10-CM | POA: Diagnosis not present

## 2023-10-08 DIAGNOSIS — Y9368 Activity, volleyball (beach) (court): Secondary | ICD-10-CM | POA: Insufficient documentation

## 2023-10-08 DIAGNOSIS — M25462 Effusion, left knee: Secondary | ICD-10-CM | POA: Diagnosis not present

## 2023-10-08 NOTE — ED Triage Notes (Signed)
Pt to triage via wheelchair.  Pt has left knee pain.  Pt injured tonight playing volleyball.  Pt alert, tearful in triage.

## 2023-10-09 ENCOUNTER — Emergency Department
Admission: EM | Admit: 2023-10-09 | Discharge: 2023-10-09 | Disposition: A | Discharge status: Home / Self Care | Payer: BC Managed Care – PPO | Attending: Emergency Medicine | Admitting: Emergency Medicine

## 2023-10-09 DIAGNOSIS — M25462 Effusion, left knee: Secondary | ICD-10-CM | POA: Diagnosis not present

## 2023-10-09 DIAGNOSIS — S8992XA Unspecified injury of left lower leg, initial encounter: Secondary | ICD-10-CM

## 2023-10-09 DIAGNOSIS — S80912A Unspecified superficial injury of left knee, initial encounter: Secondary | ICD-10-CM | POA: Diagnosis not present

## 2023-10-09 DIAGNOSIS — S8982XA Other specified injuries of left lower leg, initial encounter: Secondary | ICD-10-CM | POA: Diagnosis not present

## 2023-10-09 MED ORDER — IBUPROFEN 800 MG PO TABS
800.0000 mg | ORAL_TABLET | Freq: Once | ORAL | Status: AC
  Administered 2023-10-09: 800 mg via ORAL
  Filled 2023-10-09: qty 1

## 2023-10-09 MED ORDER — HYDROCODONE-ACETAMINOPHEN 5-325 MG PO TABS: 1.0000 | ORAL_TABLET | Freq: Three times a day (TID) | ORAL | 0 refills | Status: DC | PRN

## 2023-10-09 MED ORDER — ONDANSETRON 4 MG PO TBDP: 4.0000 mg | ORAL_TABLET | Freq: Four times a day (QID) | ORAL | 0 refills | Status: DC | PRN

## 2023-10-09 MED ORDER — IBUPROFEN 800 MG PO TABS: 800.0000 mg | ORAL_TABLET | Freq: Three times a day (TID) | ORAL | 0 refills | Status: DC | PRN

## 2023-10-09 MED ORDER — HYDROCODONE-ACETAMINOPHEN 5-325 MG PO TABS
2.0000 | ORAL_TABLET | Freq: Once | ORAL | Status: AC
  Administered 2023-10-09: 2 via ORAL
  Filled 2023-10-09: qty 2

## 2023-10-09 NOTE — Discharge Instructions (Addendum)

## 2023-10-09 NOTE — ED Provider Notes (Signed)
[facility] Provider Note    Event Date/Time   First MD Initiated Contact with Patient 10/09/23 0024     (approximate)   History   Knee Injury   HPI  Pamela Munoz is a 27 y.o. female with history of obesity who presents to the emergency department with left knee pain.  States she injured it while playing volleyball tonight.  States she felt a pop in her knee and it immediately became swollen.  She states that she has had previous ACL tears that have required surgical intervention and reports this feels similar.  No other injury, did not fall to the ground or hit her head.   History provided by patient, husband, friend.    Past Medical History:  Diagnosis Date   Morbid obesity with BMI of 40.0-44.9, adult [facility])    Osteochondroma 2011   left leg    Past Surgical History:  Procedure Laterality Date   ANTERIOR CRUCIATE LIGAMENT REPAIR Right 08/03/2017   Procedure: ANTERIOR CRUCIATE LIGAMENT (ACL) REPAIR;  Surgeon: Signa Kell, MD;  Location: [facility];  Service: Orthopedics;  Laterality: Right;   KNEE ARTHROSCOPY Right 08/03/2017   Procedure: ARTHROSCOPY KNEERIGHT MEDIAL MENISCUS REPAIR;  Surgeon: Signa Kell, MD;  Location: [facility];  Service: Orthopedics;  Laterality: Right;  MTF CANNULATED DOWELS- WES CONN STRYKER VERSITOMIC REAMERS +/- INJECTABLE MAP3 [place] FRYE ARTHREX DILATORS CORING REAMERS +/- DBM ALEX ROBERTS BIOMET REMOVAL DEVICE (POSSIBLE) (UNKNOWN REP-TRYING TO FIND OUT 70 DEGREE ARTHROSCOPE   KNEE ARTHROSCOPY WITH ANTERIOR CRUCIATE LIGAMENT (ACL) REPAIR Right    KNEE SURGERY Left    bone spur   TONSILLECTOMY     WISDOM TOOTH EXTRACTION      MEDICATIONS:  Prior to Admission medications   Not on File    Physical Exam   Triage Vital Signs: ED Triage Vitals  Encounter Vitals Group     BP 10/08/23 2313 126/77     Systolic BP Percentile --      Diastolic BP Percentile --      Pulse Rate 10/08/23 2313 92      Resp 10/08/23 2313 20     Temp 10/08/23 2315 98 F (36.7 C)     Temp Source 10/08/23 2313 Oral     SpO2 10/08/23 2313 98 %     Weight 10/08/23 2311 275 lb (124.7 kg)     Height 10/08/23 2311 5\' 6"  (1.676 m)     Head Circumference --      Peak Flow --      Pain Score 10/08/23 2311 10     Pain Loc --      Pain Education --      Exclude from Growth Chart --     Most recent vital signs: Vitals:   10/08/23 2313 10/08/23 2315  BP: 126/77   Pulse: 92   Resp: 20   Temp:  98 F (36.7 C)  SpO2: 98%      CONSTITUTIONAL: Alert and responds appropriately to questions. Well-appearing; well-nourished HEAD: Normocephalic, atraumatic EYES: Conjunctivae clear, pupils appear equal ENT: normal nose; moist mucous membranes NECK: Normal range of motion CARD: Regular rate and rhythm RESP: Normal chest excursion without splinting or tachypnea; no hypoxia or respiratory distress, speaking full sentences ABD/GI: non-distended EXT: Tender to palpation over the left knee with joint effusion.  No ecchymosis.  No appreciable ligamentous laxity but exam limited due to large effusion.  2+ left DP pulse.  No calf tenderness or calf  swelling.  Compartments soft. SKIN: Normal color for age and race, no rashes on exposed skin NEURO: Moves all extremities equally, normal speech, no facial asymmetry noted PSYCH: The patient's mood and manner are appropriate. Grooming and personal hygiene are appropriate.  ED Results / Procedures / Treatments   LABS: (all labs ordered are listed, but only abnormal results are displayed) Labs Reviewed - No data to display   EKG:  EKG Interpretation Date/Time:    Ventricular Rate:    PR Interval:    QRS Duration:    QT Interval:    QTC Calculation:   R Axis:      Text Interpretation:            RADIOLOGY: My personal review and interpretation of imaging: X-ray shows no acute bony abnormality.  I have personally reviewed all radiology reports. DG Knee  Complete 4 Views Left  Result Date: 10/08/2023 CLINICAL DATA:  Volleyball injury EXAM: LEFT KNEE - COMPLETE 4+ VIEW COMPARISON:  None Available. FINDINGS: No definitive fracture or malalignment is seen. There is a small knee effusion. The joint spaces are patent IMPRESSION: No acute osseous abnormality. Small knee effusion. Electronically Signed   By: Jasmine Pang M.D.   On: 10/08/2023 23:46     PROCEDURES:  Critical Care performed: No    Procedures    IMPRESSION / MDM / ASSESSMENT AND PLAN / ED COURSE  I reviewed the triage vital signs and the nursing notes.   Patient here after left knee injury.    DIFFERENTIAL DIAGNOSIS (includes but not limited to):   ACL tear, sprain, less likely fracture or dislocation  Patient's presentation is most consistent with acute complicated illness / injury requiring diagnostic workup.  PLAN: X-ray obtained from triage reviewed and interpreted by myself and the radiologist and shows no acute bony abnormality.  I suspect that she has had an ACL tear given she heard a pop and has significant swelling on exam.  Has history of the same.  Neurovascular intact distally.  Will place in knee brace and provided with crutches.  Recommend rest, elevation and frequent icing of the knee.  Will provide with orthopedic follow-up, prescription for pain medication.  Patient and family comfortable with this plan.   MEDICATIONS GIVEN IN ED: Medications  HYDROcodone-acetaminophen (NORCO/VICODIN) 5-325 MG per tablet 2 tablet (has no administration in time range)  ibuprofen (ADVIL) tablet 800 mg (has no administration in time range)     ED COURSE:  At this time, I do not feel there is any life-threatening condition present. I reviewed all nursing notes, vitals, pertinent previous records.  All lab and urine results, EKGs, imaging ordered have been independently reviewed and interpreted by myself.  I reviewed all available radiology reports from any imaging ordered  this visit.  Based on my assessment, I feel the patient is safe to be discharged home without further emergent workup and can continue workup as an outpatient as needed. Discussed all findings, treatment plan as well as usual and customary return precautions.  They verbalize understanding and are comfortable with this plan.  Outpatient follow-up has been provided as needed.  All questions have been answered.    CONSULTS:  none   OUTSIDE RECORDS REVIEWED: Reviewed last OB/GYN note in June 2023.     FINAL CLINICAL IMPRESSION(S) / ED DIAGNOSES   Final diagnoses:  Injury of left knee, initial encounter     Rx / DC Orders   ED Discharge Orders  Ordered    HYDROcodone-acetaminophen (NORCO/VICODIN) 5-325 MG tablet  Every 8 hours PRN        10/09/23 0036    ibuprofen (ADVIL) 800 MG tablet  Every 8 hours PRN        10/09/23 0036    ondansetron (ZOFRAN-ODT) 4 MG disintegrating tablet  Every 6 hours PRN        10/09/23 0036             Note:  This document was prepared using Dragon voice recognition software and may include unintentional dictation errors.   Asar Evilsizer, Layla Maw, DO 10/09/23 [id number]

## 2023-11-10 DIAGNOSIS — S83512D Sprain of anterior cruciate ligament of left knee, subsequent encounter: Secondary | ICD-10-CM | POA: Diagnosis not present

## 2023-11-14 ENCOUNTER — Ambulatory Visit: Payer: BC Managed Care – PPO

## 2023-11-14 DIAGNOSIS — Z7189 Other specified counseling: Secondary | ICD-10-CM | POA: Diagnosis not present

## 2023-11-14 DIAGNOSIS — Z3201 Encounter for pregnancy test, result positive: Secondary | ICD-10-CM

## 2023-11-14 LAB — POCT URINE PREGNANCY: Preg Test, Ur: POSITIVE — AB

## 2023-11-14 NOTE — Progress Notes (Deleted)
 Pamela Munoz here for a UPT. Pt had a negative upt at home. Pt needing UPT and HCG for documentation for her knee surgery     UPT in office Negative.

## 2023-11-15 LAB — BETA HCG QUANT (REF LAB): hCG Quant: 79 m[IU]/mL

## 2023-11-15 NOTE — L&D Delivery Note (Signed)
 OB/GYN Faculty Practice Delivery Note  Pamela Munoz is a 28 y.o. G3P1011 s/p SVD at [redacted]w[redacted]d. She was admitted for IOL due to gHTN.   ROM: 5h 76m with clear fluid GBS Status: Negative/-- (08/19 0900) Maximum Maternal Temperature: Afebrile throughout labor   Labor Progress: Initial SVE: 3/50/-2. She then progressed to complete.   Delivery Date/Time: 07/07/24 0114 Delivery: Called to room and patient was complete and pushing. Head delivered LOA. A loose nuchal was reduced at the perineum. Shoulder and body delivered in usual fashion. Infant with spontaneous cry, placed on mother's abdomen, dried and stimulated. Cord clamped x 2 after 1-minute delay, and cut by father of baby, Matt. Cord blood drawn. Placenta delivered spontaneously with gentle cord traction. Fundus firm with massage, TXA, and Pitocin . Labia, perineum, vagina, and cervix inspected inspected with a small first degree laceration noted, which was repaired in the usual fashion..  Baby Weight: pending  Placenta: Sent to L&D Complications: None Lacerations: First degree - repaired EBL: 160 mL Analgesia: Epidural   Infant:  APGAR (1 MIN): 9  APGAR (5 MINS): 9   Alain Sor, MD OB Fellow, Faculty Practice [facility], Center for [facility]

## 2023-11-16 NOTE — Progress Notes (Signed)
 Pamela Munoz here for a UPT. Pt had a positive upt at home.Marland Kitchen     UPT in office Positive.    Reviewed medications and informed to start a PNV, if not already to follow up after HCG results

## 2023-11-21 DIAGNOSIS — M25562 Pain in left knee: Secondary | ICD-10-CM | POA: Diagnosis not present

## 2023-12-05 ENCOUNTER — Ambulatory Visit: Payer: BC Managed Care – PPO | Admitting: *Deleted

## 2023-12-05 ENCOUNTER — Other Ambulatory Visit (INDEPENDENT_AMBULATORY_CARE_PROVIDER_SITE_OTHER): Payer: Self-pay

## 2023-12-05 VITALS — BP 128/82 | HR 87 | Wt 299.0 lb

## 2023-12-05 DIAGNOSIS — Z3481 Encounter for supervision of other normal pregnancy, first trimester: Secondary | ICD-10-CM

## 2023-12-05 DIAGNOSIS — Z3A01 Less than 8 weeks gestation of pregnancy: Secondary | ICD-10-CM

## 2023-12-05 DIAGNOSIS — O0990 Supervision of high risk pregnancy, unspecified, unspecified trimester: Secondary | ICD-10-CM | POA: Insufficient documentation

## 2023-12-05 DIAGNOSIS — O3680X0 Pregnancy with inconclusive fetal viability, not applicable or unspecified: Secondary | ICD-10-CM

## 2023-12-05 NOTE — Progress Notes (Signed)
New OB Intake  I explained I am completing New OB Intake today. We discussed EDD of 07/25/2024, by Ultrasound. Pt is G3P1011. I reviewed her allergies, medications and Medical/Surgical/OB history.    Patient Active Problem List   Diagnosis Date Noted   Supervision of high risk pregnancy, antepartum 12/05/2023   History of postpartum gestational hypertension 12/20/2022   Maternal morbid obesity, antepartum (HCC) 06/02/2022    Concerns addressed today  Patient informed that the ultrasound is considered a limited obstetric ultrasound and is not intended to be a complete ultrasound exam.  Patient also informed that the ultrasound is not being completed with the intent of assessing for fetal or placental anomalies or any pelvic abnormalities. Explained that the purpose of today's ultrasound is to assess for viability.  Patient acknowledges the purpose of the exam and the limitations of the study.     Delivery Plans Plans to deliver at [facility]. Discussed the nature of our practice with multiple providers including residents and students. Due to the size of the practice, the delivering provider may not be the same as those providing prenatal care.   MyChart/Babyscripts MyChart access verified. I explained pt will have some visits in office and some virtually. Babyscripts app discussed and ordered.   Blood Pressure Cuff Blood pressure cuff discussed and given. Discussed to be used for virtual visits and or if needed BP checks weekly.  Anatomy US Explained first scheduled [place] will be around 19 weeks.   Last Pap Diagnosis  Date Value Ref Range Status  06/02/2022   Final   - Negative for intraepithelial lesion or malignancy (NILM)    First visit review I reviewed new OB appt with patient. Explained pt will be seen by Dr Vergie Living at first visit. Discussed Avelina Laine genetic screening with patient will get panorama at [facility], had horizon with first pregnancy.. Routine prenatal labs to be collected at  [facility].  Will also have patient return in 2 weeks for repeat scan due to early gestation and hx of SAB.    Scheryl Marten, RN 12/05/2023  8:58 AM

## 2023-12-19 ENCOUNTER — Ambulatory Visit: Payer: BC Managed Care – PPO | Admitting: *Deleted

## 2023-12-19 ENCOUNTER — Other Ambulatory Visit (INDEPENDENT_AMBULATORY_CARE_PROVIDER_SITE_OTHER): Payer: Self-pay

## 2023-12-19 VITALS — BP 121/83 | HR 75 | Wt 300.0 lb

## 2023-12-19 DIAGNOSIS — O0990 Supervision of high risk pregnancy, unspecified, unspecified trimester: Secondary | ICD-10-CM

## 2023-12-19 DIAGNOSIS — Z3A01 Less than 8 weeks gestation of pregnancy: Secondary | ICD-10-CM

## 2023-12-19 DIAGNOSIS — O0991 Supervision of high risk pregnancy, unspecified, first trimester: Secondary | ICD-10-CM | POA: Diagnosis not present

## 2023-12-19 DIAGNOSIS — Z3A08 8 weeks gestation of pregnancy: Secondary | ICD-10-CM

## 2023-12-19 NOTE — Progress Notes (Signed)
 Pt is here for repeat scan due to history od SAB.  We discussed EDD of 07/25/2024, by her first ultrasound. Pt is G3P1011. I reviewed her allergies, medications and Medical/Surgical/OB history.    Patient Active Problem List   Diagnosis Date Noted   Supervision of high risk pregnancy, antepartum 12/05/2023   History of postpartum gestational hypertension 12/20/2022   Maternal morbid obesity, antepartum (HCC) 06/02/2022    Concerns addressed today  Patient informed that the ultrasound is considered a limited obstetric ultrasound and is not intended to be a complete ultrasound exam.  Patient also informed that the ultrasound is not being completed with the intent of assessing for fetal or placental anomalies or any pelvic abnormalities. Explained that the purpose of today's ultrasound is to assess for viability.  Patient acknowledges the purpose of the exam and the limitations of the study.      Pt to follow up at [facility]   Wanda Buckles, RN

## 2023-12-26 ENCOUNTER — Ambulatory Visit: Payer: Self-pay | Admitting: Nurse Practitioner

## 2024-01-01 ENCOUNTER — Other Ambulatory Visit (HOSPITAL_COMMUNITY)
Admission: RE | Admit: 2024-01-01 | Discharge: 2024-01-01 | Disposition: A | Discharge status: Home / Self Care | Payer: BC Managed Care – PPO | Source: Ambulatory Visit | Attending: Obstetrics and Gynecology | Admitting: Obstetrics and Gynecology

## 2024-01-01 ENCOUNTER — Ambulatory Visit: Payer: BC Managed Care – PPO | Admitting: Obstetrics and Gynecology

## 2024-01-01 VITALS — BP 127/82 | HR 87 | Wt 301.0 lb

## 2024-01-01 DIAGNOSIS — O0991 Supervision of high risk pregnancy, unspecified, first trimester: Secondary | ICD-10-CM | POA: Diagnosis not present

## 2024-01-01 DIAGNOSIS — O99210 Obesity complicating pregnancy, unspecified trimester: Secondary | ICD-10-CM

## 2024-01-01 DIAGNOSIS — Z8759 Personal history of other complications of pregnancy, childbirth and the puerperium: Secondary | ICD-10-CM

## 2024-01-01 DIAGNOSIS — Z1339 Encounter for screening examination for other mental health and behavioral disorders: Secondary | ICD-10-CM

## 2024-01-01 DIAGNOSIS — Z6841 Body Mass Index (BMI) 40.0 and over, adult: Secondary | ICD-10-CM | POA: Insufficient documentation

## 2024-01-01 DIAGNOSIS — O0990 Supervision of high risk pregnancy, unspecified, unspecified trimester: Secondary | ICD-10-CM | POA: Diagnosis not present

## 2024-01-01 DIAGNOSIS — Z3A10 10 weeks gestation of pregnancy: Secondary | ICD-10-CM

## 2024-01-01 DIAGNOSIS — Z3481 Encounter for supervision of other normal pregnancy, first trimester: Secondary | ICD-10-CM

## 2024-01-01 DIAGNOSIS — Z87828 Personal history of other (healed) physical injury and trauma: Secondary | ICD-10-CM

## 2024-01-01 MED ORDER — DOXYLAMINE SUCCINATE (SLEEP) 25 MG PO TABS: 25.0000 mg | ORAL_TABLET | Freq: Two times a day (BID) | ORAL | Status: DC | PRN

## 2024-01-01 MED ORDER — VITAMIN B-6 25 MG PO TABS: 25.0000 mg | ORAL_TABLET | Freq: Two times a day (BID) | ORAL | Status: DC | PRN

## 2024-01-01 NOTE — Progress Notes (Unsigned)
 NOB   Last pap:05/2022 Genetic Screening: YES/ wants to know gender.   CC: None

## 2024-01-02 DIAGNOSIS — Z87828 Personal history of other (healed) physical injury and trauma: Secondary | ICD-10-CM

## 2024-01-02 HISTORY — DX: Personal history of other (healed) physical injury and trauma: Z87.828

## 2024-01-02 LAB — CBC/D/PLT+RPR+RH+ABO+RUBIGG...
Antibody Screen: NEGATIVE
Basophils Absolute: 0 10*3/uL (ref 0.0–0.2)
Basos: 0 %
EOS (ABSOLUTE): 0.1 10*3/uL (ref 0.0–0.4)
Eos: 1 %
HCV Ab: NONREACTIVE
HIV Screen 4th Generation wRfx: NONREACTIVE
Hematocrit: 38.9 % (ref 34.0–46.6)
Hemoglobin: 12.7 g/dL (ref 11.1–15.9)
Hepatitis B Surface Ag: NEGATIVE
Immature Grans (Abs): 0 10*3/uL (ref 0.0–0.1)
Immature Granulocytes: 0 %
Lymphocytes Absolute: 2 10*3/uL (ref 0.7–3.1)
Lymphs: 28 %
MCH: 26.5 pg — ABNORMAL LOW (ref 26.6–33.0)
MCHC: 32.6 g/dL (ref 31.5–35.7)
MCV: 81 fL (ref 79–97)
Monocytes Absolute: 0.6 10*3/uL (ref 0.1–0.9)
Monocytes: 8 %
Neutrophils Absolute: 4.5 10*3/uL (ref 1.4–7.0)
Neutrophils: 63 %
Platelets: 255 10*3/uL (ref 150–450)
RBC: 4.79 x10E6/uL (ref 3.77–5.28)
RDW: 15 % (ref 11.7–15.4)
RPR Ser Ql: NONREACTIVE
Rh Factor: POSITIVE
Rubella Antibodies, IGG: 1.4 {index} (ref >0.99)
WBC: 7.1 10*3/uL (ref 3.4–10.8)

## 2024-01-02 LAB — COMPREHENSIVE METABOLIC PANEL
ALT: 17 [IU]/L (ref 0–32)
AST: 16 [IU]/L (ref 0–40)
Albumin: 4 g/dL (ref 4.0–5.0)
Alkaline Phosphatase: 79 [IU]/L (ref 44–121)
BUN/Creatinine Ratio: 15 (ref 9–23)
BUN: 7 mg/dL (ref 6–20)
Bilirubin Total: 0.2 mg/dL (ref 0.0–1.2)
CO2: 19 mmol/L — ABNORMAL LOW (ref 20–29)
Calcium: 8.9 mg/dL (ref 8.7–10.2)
Chloride: 104 mmol/L (ref 96–106)
Creatinine, Ser: 0.46 mg/dL — ABNORMAL LOW (ref 0.57–1.00)
Globulin, Total: 2.2 g/dL (ref 1.5–4.5)
Glucose: 85 mg/dL (ref 70–99)
Potassium: 4.1 mmol/L (ref 3.5–5.2)
Sodium: 136 mmol/L (ref 134–144)
Total Protein: 6.2 g/dL (ref 6.0–8.5)
eGFR: 135 mL/min/{1.73_m2} (ref >59)

## 2024-01-02 LAB — PROTEIN / CREATININE RATIO, URINE
Creatinine, Urine: 31.3 mg/dL
Protein, Ur: <4 mg/dL

## 2024-01-02 LAB — URINE CYTOLOGY ANCILLARY ONLY
Chlamydia: NEGATIVE
Comment: NEGATIVE
Comment: NORMAL
Neisseria Gonorrhea: NEGATIVE

## 2024-01-02 LAB — HEMOGLOBIN A1C
Est. average glucose Bld gHb Est-mCnc: 103 mg/dL
Hgb A1c MFr Bld: 5.2 % (ref 4.8–5.6)

## 2024-01-02 LAB — TSH RFX ON ABNORMAL TO FREE T4: TSH: 0.579 u[IU]/mL (ref 0.450–4.500)

## 2024-01-02 LAB — HCV INTERPRETATION

## 2024-01-02 NOTE — Progress Notes (Signed)
 New OB Note  01/01/2024   Clinic: Center for [facility]  Chief Complaint: new OB  Transfer of Care Patient: no  History of Present Illness: Ms. Haslem is a 28 y.o. G3P1011 at 10/4 weeks (EDC 9/11, based on 6wk u/s). Patient's last menstrual period was 10/08/2023.  Preg complicated by has Obesity in pregnancy; History of gestational hypertension; Supervision of high risk pregnancy, antepartum; BMI 45.0-49.9, adult (HCC); and History of torn meniscus of left knee on their problem list.   No s/s of SAB. Feeling tired, mild nausea of pregnancy. Not on anything for contraception when conceived.  ROS: A 12-point review of systems was performed and negative, except as stated in the above HPI.  OBGYN History: As per HPI. OB History  Gravida Para Term Preterm AB Living  3 1 1  1 1   SAB IAB Ectopic Multiple Live Births  1   0 1    # Outcome Date GA Lbr Len/2nd Weight Sex Type Anes PTL Lv  3 Current           2 SAB 08/2023          1 Term 11/08/22 [redacted]w[redacted]d 20:19 / 00:38 7 lb 9.3 oz (3.44 kg) M Vag-Spont EPI  LIV   Prior children are healthy, doing well, and without any problems or issues: yes History of pap smears: Yes. Last pap smear 2023 and results were wnl   Past Medical History: Past Medical History:  Diagnosis Date   Morbid obesity with BMI of 40.0-44.9, adult [facility])    Osteochondroma 2011   left leg    Past Surgical History: Past Surgical History:  Procedure Laterality Date   ANTERIOR CRUCIATE LIGAMENT REPAIR Right 08/03/2017   Procedure: ANTERIOR CRUCIATE LIGAMENT (ACL) REPAIR;  Surgeon: Signa Kell, MD;  Location: [facility];  Service: Orthopedics;  Laterality: Right;   KNEE ARTHROSCOPY Right 08/03/2017   Procedure: ARTHROSCOPY KNEERIGHT MEDIAL MENISCUS REPAIR;  Surgeon: Signa Kell, MD;  Location: [facility];  Service: Orthopedics;  Laterality: Right;  MTF CANNULATED DOWELS- WES CONN STRYKER VERSITOMIC REAMERS +/- INJECTABLE MAP3 [place]  FRYE ARTHREX DILATORS CORING REAMERS +/- DBM ALEX ROBERTS BIOMET REMOVAL DEVICE (POSSIBLE) (UNKNOWN REP-TRYING TO FIND OUT 70 DEGREE ARTHROSCOPE   KNEE ARTHROSCOPY WITH ANTERIOR CRUCIATE LIGAMENT (ACL) REPAIR Right    KNEE SURGERY Left    bone spur   TONSILLECTOMY     WISDOM TOOTH EXTRACTION      Family History:  Family History  Problem Relation Age of Onset   Endometriosis Mother    Diabetes Maternal Grandfather     Social History:  Social History   Socioeconomic History   Marital status: Married    Spouse name: Kaliope Quinonez   Number of children: Not on file   Years of education: Not on file   Highest education level: Not on file  Occupational History   Occupation: Associate Professor  Tobacco Use   Smoking status: Never    Passive exposure: Never   Smokeless tobacco: Never  Vaping Use   Vaping status: Never Used  Substance and Sexual Activity   Alcohol use: No   Drug use: No   Sexual activity: Yes    Partners: Male    Birth control/protection: None    Comment: Pregnant  Other Topics Concern   Not on file  Social History Narrative   ** Merged History Encounter **       Social Drivers of Corporate investment banker Strain: Not on file  Food Insecurity: No Food Insecurity (11/07/2022)   Hunger Vital Sign    Worried About Running Out of Food in the Last Year: Never true    Ran Out of Food in the Last Year: Never true  Transportation Needs: No Transportation Needs (11/07/2022)   PRAPARE - Administrator, Civil Service (Medical): No    Lack of Transportation (Non-Medical): No  Physical Activity: Not on file  Stress: Not on file  Social Connections: Not on file  Intimate Partner Violence: Not At Risk (11/07/2022)   Humiliation, Afraid, Rape, and Kick questionnaire    Fear of Current or Ex-Partner: No    Emotionally Abused: No    Physically Abused: No    Sexually Abused: No   Allergy: Allergies  Allergen Reactions   Percocet  [Oxycodone-Acetaminophen] Itching   Current Outpatient Medications: Prenatal  Physical Exam:   BP 127/82   Pulse 87   Wt (!) 301 lb (136.5 kg)   LMP 10/08/2023   BMI 48.58 kg/m  Body mass index is 48.58 kg/m. Contractions: Not present Vag. Bleeding: None. Fundal height: not applicable FHTs: 160s  General appearance: Well nourished, well developed female in no acute distress.  Neck:  Supple, normal appearance, and no thyromegaly  Cardiovascular: S1, S2 normal, no murmur, rub or gallop, regular rate and rhythm Respiratory:  Clear to auscultation bilateral. Normal respiratory effort Abdomen: positive bowel sounds and no masses, hernias; diffusely non tender to palpation, non distended Neuro/Psych:  Normal mood and affect.  Skin:  Warm and dry.   Laboratory: none  Imaging:  As per HPI  Assessment: patient doing well  Plan: 1. Supervision of high risk pregnancy, antepartum (Primary) Patient doing well. Behavioral strategies and OTCs recommended for nausea. Offer afp if 15wks or greater next visit. Order anatomy u/s next visit.  - CBC/D/Plt+RPR+Rh+ABO+RubIgG... - Hemoglobin A1c - Culture, OB Urine - Urine cytology ancillary only - Comprehensive metabolic panel - Protein / creatinine ratio, urine - TSH Rfx on Abnormal to Free T4  2. Encounter for supervision of other normal pregnancy in first trimester - PANORAMA PRENATAL TEST  3. History of gestational hypertension Recommend starting low dose ASA at next visit  4. BMI 45.0-49.9, adult (HCC) Total weight gain goals d/w her  5. Obesity in pregnancy  6. [redacted] weeks gestation of pregnancy  7. History of torn meniscus of left knee Pt to have surgery but then got pregnant. Recommended her to follow up with PT again  Problem list reviewed and updated.  Follow up in 4 weeks.  >50% of 30 min visit spent on counseling and coordination of care.  Return in about 1 month (around 01/29/2024) for in person, low risk ob, md  or app.  Future Appointments  Date Time Provider Department Center  01/29/2024  4:10 PM Federico Flake, MD [facility]  02/26/2024  4:10 PM [place] Bing, MD [facility]    Cornelia Copa MD Attending Center for [facility])

## 2024-01-03 LAB — URINE CULTURE, OB REFLEX

## 2024-01-03 LAB — CULTURE, OB URINE

## 2024-01-10 LAB — PANORAMA PRENATAL TEST FULL PANEL:PANORAMA TEST PLUS 5 ADDITIONAL MICRODELETIONS: FETAL FRACTION: 4.1

## 2024-01-29 ENCOUNTER — Encounter: Payer: Self-pay | Admitting: Family Medicine

## 2024-01-29 ENCOUNTER — Ambulatory Visit (INDEPENDENT_AMBULATORY_CARE_PROVIDER_SITE_OTHER): Payer: BC Managed Care – PPO | Admitting: Family Medicine

## 2024-01-29 VITALS — BP 128/82 | HR 99 | Wt 304.0 lb

## 2024-01-29 DIAGNOSIS — Z3A14 14 weeks gestation of pregnancy: Secondary | ICD-10-CM

## 2024-01-29 DIAGNOSIS — Z8759 Personal history of other complications of pregnancy, childbirth and the puerperium: Secondary | ICD-10-CM

## 2024-01-29 DIAGNOSIS — O99210 Obesity complicating pregnancy, unspecified trimester: Secondary | ICD-10-CM

## 2024-01-29 DIAGNOSIS — O0990 Supervision of high risk pregnancy, unspecified, unspecified trimester: Secondary | ICD-10-CM

## 2024-01-29 MED ORDER — ASPIRIN 81 MG PO TBEC: 162.0000 mg | DELAYED_RELEASE_TABLET | Freq: Every day | ORAL | 2 refills | Status: DC

## 2024-01-29 NOTE — Progress Notes (Signed)
 ROB   CC: None

## 2024-01-29 NOTE — Progress Notes (Signed)
   PRENATAL VISIT NOTE  Subjective:  Pamela Munoz is a 28 y.o. G3P1011 at [redacted]w[redacted]d being seen today for ongoing prenatal care.  She is currently monitored for the following issues for this high-risk pregnancy and has Obesity in pregnancy; History of gestational hypertension; Supervision of high risk pregnancy, antepartum; BMI 45.0-49.9, adult (HCC); and History of torn meniscus of left knee on their problem list.  Patient reports no complaints.  Contractions: Not present. Vag. Bleeding: None.  Movement: Present. Denies leaking of fluid.   The following portions of the patient's history were reviewed and updated as appropriate: allergies, current medications, past family history, past medical history, past social history, past surgical history and problem list.   Objective:   Vitals:   01/29/24 1632  BP: 128/82  Pulse: 99  Weight: (!) 304 lb (137.9 kg)    Fetal Status: Fetal Heart Rate (bpm): 148   Movement: Present     General:  Alert, oriented and cooperative. Patient is in no acute distress.  Skin: Skin is warm and dry. No rash noted.   Cardiovascular: Normal heart rate noted  Respiratory: Normal respiratory effort, no problems with respiration noted  Abdomen: Soft, gravid, appropriate for gestational age.  Pain/Pressure: Absent     Pelvic: Cervical exam deferred        Extremities: Normal range of motion.  Edema: None  Mental Status: Normal mood and affect. Normal behavior. Normal judgment and thought content.   Assessment and Plan:  Pregnancy: G3P1011 at [redacted]w[redacted]d 1. Obesity in pregnancy TWG=5 lb (2.268 kg)  - aspirin EC 81 MG tablet; Take 2 tablets (162 mg total) by mouth daily. Take after 12 weeks for prevention of preeclampsia later in pregnancy  Dispense: 300 tablet; Refill: 2  2. History of gestational hypertension - aspirin EC 81 MG tablet; Take 2 tablets (162 mg total) by mouth daily. Take after 12 weeks for prevention of preeclampsia later in pregnancy  Dispense: 300  tablet; Refill: 2  3. Supervision of high risk pregnancy, antepartum (Primary) Up to date FHR appropriate Some nausea but overall improving Discussed ASA and recommended 162mg  dose - aspirin EC 81 MG tablet; Take 2 tablets (162 mg total) by mouth daily. Take after 12 weeks for prevention of preeclampsia later in pregnancy  Dispense: 300 tablet; Refill: 2  4. [redacted] weeks gestation of pregnancy - aspirin EC 81 MG tablet; Take 2 tablets (162 mg total) by mouth daily. Take after 12 weeks for prevention of preeclampsia later in pregnancy  Dispense: 300 tablet; Refill: 2  Preterm labor symptoms and general obstetric precautions including but not limited to vaginal bleeding, contractions, leaking of fluid and fetal movement were reviewed in detail with the patient. Please refer to After Visit Summary for other counseling recommendations.   No follow-ups on file.  Future Appointments  Date Time Provider Department Center  02/26/2024  4:10 PM [place] Bing, MD [facility]  03/25/2024  3:30 PM [place] Bing, MD [facility]    Federico Flake, MD

## 2024-02-26 ENCOUNTER — Ambulatory Visit: Payer: BC Managed Care – PPO | Admitting: Obstetrics and Gynecology

## 2024-02-26 VITALS — BP 133/83 | HR 85

## 2024-02-26 DIAGNOSIS — Z3A18 18 weeks gestation of pregnancy: Secondary | ICD-10-CM | POA: Diagnosis not present

## 2024-02-26 DIAGNOSIS — Z8759 Personal history of other complications of pregnancy, childbirth and the puerperium: Secondary | ICD-10-CM | POA: Diagnosis not present

## 2024-02-26 DIAGNOSIS — O99210 Obesity complicating pregnancy, unspecified trimester: Secondary | ICD-10-CM | POA: Diagnosis not present

## 2024-02-26 DIAGNOSIS — Z6841 Body Mass Index (BMI) 40.0 and over, adult: Secondary | ICD-10-CM

## 2024-02-26 NOTE — Progress Notes (Signed)
 ROB   CC: None

## 2024-02-27 ENCOUNTER — Other Ambulatory Visit: Payer: Self-pay | Admitting: *Deleted

## 2024-02-27 DIAGNOSIS — Z6841 Body Mass Index (BMI) 40.0 and over, adult: Secondary | ICD-10-CM

## 2024-02-27 DIAGNOSIS — O99210 Obesity complicating pregnancy, unspecified trimester: Secondary | ICD-10-CM

## 2024-02-27 DIAGNOSIS — O0990 Supervision of high risk pregnancy, unspecified, unspecified trimester: Secondary | ICD-10-CM

## 2024-02-27 NOTE — Progress Notes (Signed)
   PRENATAL VISIT NOTE  Subjective:  Pamela Munoz is a 28 y.o. G3P1011 at [redacted]w[redacted]d being seen today for ongoing prenatal care.  She is currently monitored for the following issues for this low-risk pregnancy and has Obesity in pregnancy; History of gestational hypertension; Supervision of high risk pregnancy, antepartum; BMI 45.0-49.9, adult (HCC); and History of torn meniscus of left knee on their problem list.  Patient reports no complaints.  Contractions: Not present. Vag. Bleeding: None.  Movement: Present. Denies leaking of fluid.   The following portions of the patient's history were reviewed and updated as appropriate: allergies, current medications, past family history, past medical history, past social history, past surgical history and problem list.   Objective:   Vitals:   02/26/24 1607  BP: 133/83  Pulse: 85    Fetal Status: Fetal Heart Rate (bpm): 148   Movement: Present     General:  Alert, oriented and cooperative. Patient is in no acute distress.  Skin: Skin is warm and dry. No rash noted.   Cardiovascular: Normal heart rate noted  Respiratory: Normal respiratory effort, no problems with respiration noted  Abdomen: Soft, gravid, appropriate for gestational age.  Pain/Pressure: Absent     Pelvic: Cervical exam deferred        Extremities: Normal range of motion.  Edema: None  Mental Status: Normal mood and affect. Normal behavior. Normal judgment and thought content.   Assessment and Plan:  Pregnancy: G3P1011 at [redacted]w[redacted]d 1. [redacted] weeks gestation of pregnancy (Primary) Defers afp. Needs anatomy u/s scheduled. Message sent to [facility] pool to set up  2. History of gestational hypertension Continue low dose ASA  3. BMI 45.0-49.9, adult (HCC)  4. Obesity in pregnancy  Preterm labor symptoms and general obstetric precautions including but not limited to vaginal bleeding, contractions, leaking of fluid and fetal movement were reviewed in detail with the patient. Please refer to  After Visit Summary for other counseling recommendations.   Return in about 4 months (around 06/27/2024) for in person, md or app, low risk ob.  Future Appointments  Date Time Provider Department Center  03/25/2024  3:30 PM Raynell Caller, MD [facility]  04/30/2024 10:40 AM Tasia Farr, FNP [facility]    Raynell Caller, MD

## 2024-03-15 ENCOUNTER — Ambulatory Visit

## 2024-03-20 ENCOUNTER — Other Ambulatory Visit: Payer: Self-pay | Admitting: *Deleted

## 2024-03-20 ENCOUNTER — Ambulatory Visit

## 2024-03-20 ENCOUNTER — Ambulatory Visit: Attending: Obstetrics and Gynecology | Admitting: Obstetrics

## 2024-03-20 VITALS — BP 130/67 | HR 77

## 2024-03-20 DIAGNOSIS — Z6841 Body Mass Index (BMI) 40.0 and over, adult: Secondary | ICD-10-CM

## 2024-03-20 DIAGNOSIS — O99212 Obesity complicating pregnancy, second trimester: Secondary | ICD-10-CM | POA: Insufficient documentation

## 2024-03-20 DIAGNOSIS — O132 Gestational [pregnancy-induced] hypertension without significant proteinuria, second trimester: Secondary | ICD-10-CM | POA: Diagnosis not present

## 2024-03-20 DIAGNOSIS — Z8759 Personal history of other complications of pregnancy, childbirth and the puerperium: Secondary | ICD-10-CM

## 2024-03-20 DIAGNOSIS — E669 Obesity, unspecified: Secondary | ICD-10-CM | POA: Diagnosis not present

## 2024-03-20 DIAGNOSIS — Z363 Encounter for antenatal screening for malformations: Secondary | ICD-10-CM

## 2024-03-20 DIAGNOSIS — O0990 Supervision of high risk pregnancy, unspecified, unspecified trimester: Secondary | ICD-10-CM

## 2024-03-20 DIAGNOSIS — O99210 Obesity complicating pregnancy, unspecified trimester: Secondary | ICD-10-CM

## 2024-03-20 DIAGNOSIS — Z3A21 21 weeks gestation of pregnancy: Secondary | ICD-10-CM

## 2024-03-20 DIAGNOSIS — O09292 Supervision of pregnancy with other poor reproductive or obstetric history, second trimester: Secondary | ICD-10-CM | POA: Insufficient documentation

## 2024-03-20 NOTE — Progress Notes (Signed)
 MFM Consult Note  Pamela Munoz is currently at 21 weeks and 6 days.  Pamela Munoz was seen due to maternal obesity with a BMI of 48.  Her last pregnancy about a year and a half ago was complicated by gestational hypertension.  Pamela Munoz denies any problems in her current pregnancy.  Her blood pressure today was 130/67.  Pamela Munoz had a cell free DNA test earlier in her pregnancy which indicated a low risk for trisomy 36, 28, and 13. A female fetus is predicted.  Pamela Munoz was informed that the fetal growth and amniotic fluid level were appropriate for her gestational age.   There were no obvious fetal anomalies noted on today's ultrasound exam.  Today's exam was limited due to maternal body habitus.  The patient was informed that anomalies may be missed due to technical limitations. If the fetus is in a suboptimal position or maternal habitus is increased, visualization of the fetus in the maternal uterus may be impaired.  Due to maternal obesity, we will continue to follow her with growth ultrasounds throughout her pregnancy.    Weekly fetal testing should be started at around 34 weeks.    The patient was advised to continue taking 2 tablets of baby aspirin  daily for preeclampsia prophylaxis.    A follow-up exam was scheduled in 5 weeks.    Pamela Munoz stated that all of her questions were answered today.  A total of 30 minutes was spent counseling and coordinating the care for this patient.  Greater than 50% of the time was spent in direct face-to-face contact.

## 2024-03-25 ENCOUNTER — Ambulatory Visit: Admitting: Obstetrics and Gynecology

## 2024-03-25 VITALS — BP 130/81 | HR 82 | Wt 306.0 lb

## 2024-03-25 DIAGNOSIS — Z6841 Body Mass Index (BMI) 40.0 and over, adult: Secondary | ICD-10-CM

## 2024-03-25 DIAGNOSIS — Z3A22 22 weeks gestation of pregnancy: Secondary | ICD-10-CM | POA: Diagnosis not present

## 2024-03-25 DIAGNOSIS — Z8759 Personal history of other complications of pregnancy, childbirth and the puerperium: Secondary | ICD-10-CM

## 2024-03-25 DIAGNOSIS — O99210 Obesity complicating pregnancy, unspecified trimester: Secondary | ICD-10-CM | POA: Diagnosis not present

## 2024-03-25 NOTE — Progress Notes (Unsigned)
 ROB   Anatomy 03/20/24.  CC: None

## 2024-03-27 NOTE — Progress Notes (Signed)
   PRENATAL VISIT NOTE  Subjective:  Pamela Munoz is a 28 y.o. G3P1011 at [redacted]w[redacted]d being seen today for ongoing prenatal care.  She is currently monitored for the following issues for this low-risk pregnancy and has Obesity in pregnancy; History of gestational hypertension; Supervision of high risk pregnancy, antepartum; BMI 45.0-49.9, adult (HCC); and History of torn meniscus of left knee on their problem list.  Patient reports no complaints.  Contractions: Not present. Vag. Bleeding: None.  Movement: Present. Denies leaking of fluid.   The following portions of the patient's history were reviewed and updated as appropriate: allergies, current medications, past family history, past medical history, past social history, past surgical history and problem list.   Objective:    Vitals:   03/25/24 1542  BP: 130/81  Pulse: 82  Weight: (!) 306 lb (138.8 kg)    Fetal Status:  Fetal Heart Rate (bpm): 150   Movement: Present    General: Alert, oriented and cooperative. Patient is in no acute distress.  Skin: Skin is warm and dry. No rash noted.   Cardiovascular: Normal heart rate noted  Respiratory: Normal respiratory effort, no problems with respiration noted  Abdomen: Soft, gravid, appropriate for gestational age.  Pain/Pressure: Absent     Pelvic: Cervical exam deferred        Extremities: Normal range of motion.  Edema: None  Mental Status: Normal mood and affect. Normal behavior. Normal judgment and thought content.   Assessment and Plan:  Pregnancy: G3P1011 at [redacted]w[redacted]d 1. [redacted] weeks gestation of pregnancy (Primary) Anatomy u/s negative 28wk labs next visit  2. BMI 45.0-49.9, adult (HCC) Weight stable  3. Obesity in pregnancy  4. History of gestational hypertension Continue low dose ASA  Preterm labor symptoms and general obstetric precautions including but not limited to vaginal bleeding, contractions, leaking of fluid and fetal movement were reviewed in detail with the  patient. Please refer to After Visit Summary for other counseling recommendations.   Return in about 3 weeks (around 04/15/2024) for low risk ob, in person, md or app, fasting 2hr GTT.  Future Appointments  Date Time Provider Department Center  04/16/2024  8:15 AM [facility]  04/16/2024  8:35 AM Constant, Carles Cheadle, MD [facility]  04/24/2024  9:00 AM [facility] PROVIDER 1 [facility]  04/24/2024  9:30 AM [facility] US1 [facility]  04/30/2024 10:40 AM Tasia Farr, FNP [facility]  04/30/2024  1:30 PM Izell Marsh, MD [facility]  05/14/2024  1:30 PM Anyanwu, Kathrine Paris, MD [facility]  05/28/2024  1:30 PM Anyanwu, Kathrine Paris, MD [facility]  06/11/2024  1:30 PM Anyanwu, Kathrine Paris, MD [facility]    Raynell Caller, MD

## 2024-04-16 ENCOUNTER — Encounter: Payer: Self-pay | Admitting: Obstetrics and Gynecology

## 2024-04-16 ENCOUNTER — Other Ambulatory Visit

## 2024-04-16 ENCOUNTER — Ambulatory Visit (INDEPENDENT_AMBULATORY_CARE_PROVIDER_SITE_OTHER): Admitting: Obstetrics and Gynecology

## 2024-04-16 VITALS — BP 117/80 | HR 96 | Wt 315.6 lb

## 2024-04-16 DIAGNOSIS — O0990 Supervision of high risk pregnancy, unspecified, unspecified trimester: Secondary | ICD-10-CM

## 2024-04-16 DIAGNOSIS — O99210 Obesity complicating pregnancy, unspecified trimester: Secondary | ICD-10-CM

## 2024-04-16 DIAGNOSIS — Z8759 Personal history of other complications of pregnancy, childbirth and the puerperium: Secondary | ICD-10-CM

## 2024-04-16 NOTE — Progress Notes (Signed)
 ROB: declines to fill out the PHQ-9 and GAD-7   Denies any concerns

## 2024-04-16 NOTE — Progress Notes (Signed)
   PRENATAL VISIT NOTE  Subjective:  Pamela Munoz is a 28 y.o. G3P1011 at [redacted]w[redacted]d being seen today for ongoing prenatal care.  She is currently monitored for the following issues for this high-risk pregnancy and has Obesity in pregnancy; History of gestational hypertension; Supervision of high risk pregnancy, antepartum; BMI 45.0-49.9, adult (HCC); and History of torn meniscus of left knee on their problem list.  Patient reports no complaints.  Contractions: Not present. Vag. Bleeding: None.  Movement: Present. Denies leaking of fluid.   The following portions of the patient's history were reviewed and updated as appropriate: allergies, current medications, past family history, past medical history, past social history, past surgical history and problem list.   Objective:    Vitals:   04/16/24 0845  BP: 117/80  Pulse: 96  Weight: (!) 315 lb 9.6 oz (143.2 kg)    Fetal Status:  Fetal Heart Rate (bpm): 143   Movement: Present    General: Alert, oriented and cooperative. Patient is in no acute distress.  Skin: Skin is warm and dry. No rash noted.   Cardiovascular: Normal heart rate noted  Respiratory: Normal respiratory effort, no problems with respiration noted  Abdomen: Soft, gravid, appropriate for gestational age.  Pain/Pressure: Absent     Pelvic: Cervical exam deferred        Extremities: Normal range of motion.  Edema: None  Mental Status: Normal mood and affect. Normal behavior. Normal judgment and thought content.   Assessment and Plan:  Pregnancy: G3P1011 at [redacted]w[redacted]d 1. Supervision of high risk pregnancy, antepartum (Primary) Patient is doing well without complaints Third trimester labs and glucola  No plans for contraception as she desires another pregnancy  2. Obesity in pregnancy   3. History of gestational hypertension Continue ASA  Preterm labor symptoms and general obstetric precautions including but not limited to vaginal bleeding, contractions, leaking of fluid  and fetal movement were reviewed in detail with the patient. Please refer to After Visit Summary for other counseling recommendations.   Return in about 4 weeks (around 05/14/2024) for in person, ROB, High risk.  Future Appointments  Date Time Provider Department Center  04/24/2024  9:00 AM [facility] PROVIDER 1 [facility]  04/24/2024  9:30 AM [facility] US1 [facility]  04/30/2024 10:40 AM Tasia Farr, FNP [facility]  04/30/2024  1:30 PM Izell Marsh, MD [facility]  05/14/2024  1:30 PM Anyanwu, Kathrine Paris, MD [facility]  05/28/2024  1:30 PM Anyanwu, Kathrine Paris, MD [facility]  06/11/2024  1:30 PM Anyanwu, Kathrine Paris, MD [facility]    Verlyn Goad, MD

## 2024-04-17 LAB — GLUCOSE TOLERANCE, 2 HOURS W/ 1HR
Glucose, 1 hour: 167 mg/dL (ref 70–179)
Glucose, 2 hour: 122 mg/dL (ref 70–152)
Glucose, Fasting: 87 mg/dL (ref 70–91)

## 2024-04-17 LAB — CBC
Hematocrit: 39.4 % (ref 34.0–46.6)
Hemoglobin: 12.6 g/dL (ref 11.1–15.9)
MCH: 27.3 pg (ref 26.6–33.0)
MCHC: 32 g/dL (ref 31.5–35.7)
MCV: 86 fL (ref 79–97)
Platelets: 205 10*3/uL (ref 150–450)
RBC: 4.61 x10E6/uL (ref 3.77–5.28)
RDW: 14 % (ref 11.7–15.4)
WBC: 6.5 10*3/uL (ref 3.4–10.8)

## 2024-04-17 LAB — RPR: RPR Ser Ql: NONREACTIVE

## 2024-04-17 LAB — HIV ANTIBODY (ROUTINE TESTING W REFLEX): HIV Screen 4th Generation wRfx: NONREACTIVE

## 2024-04-24 ENCOUNTER — Other Ambulatory Visit: Payer: Self-pay | Admitting: *Deleted

## 2024-04-24 ENCOUNTER — Ambulatory Visit

## 2024-04-24 ENCOUNTER — Ambulatory Visit: Attending: Obstetrics | Admitting: Obstetrics and Gynecology

## 2024-04-24 VITALS — BP 121/63 | HR 81

## 2024-04-24 DIAGNOSIS — O99210 Obesity complicating pregnancy, unspecified trimester: Secondary | ICD-10-CM | POA: Diagnosis not present

## 2024-04-24 DIAGNOSIS — O99212 Obesity complicating pregnancy, second trimester: Secondary | ICD-10-CM

## 2024-04-24 DIAGNOSIS — O132 Gestational [pregnancy-induced] hypertension without significant proteinuria, second trimester: Secondary | ICD-10-CM

## 2024-04-24 DIAGNOSIS — O09292 Supervision of pregnancy with other poor reproductive or obstetric history, second trimester: Secondary | ICD-10-CM | POA: Insufficient documentation

## 2024-04-24 DIAGNOSIS — Z8759 Personal history of other complications of pregnancy, childbirth and the puerperium: Secondary | ICD-10-CM

## 2024-04-24 DIAGNOSIS — O0990 Supervision of high risk pregnancy, unspecified, unspecified trimester: Secondary | ICD-10-CM

## 2024-04-24 DIAGNOSIS — E669 Obesity, unspecified: Secondary | ICD-10-CM | POA: Diagnosis not present

## 2024-04-24 DIAGNOSIS — Z3A26 26 weeks gestation of pregnancy: Secondary | ICD-10-CM

## 2024-04-24 DIAGNOSIS — E66813 Obesity, class 3: Secondary | ICD-10-CM

## 2024-04-24 DIAGNOSIS — E6689 Other obesity not elsewhere classified: Secondary | ICD-10-CM

## 2024-04-24 DIAGNOSIS — Z363 Encounter for antenatal screening for malformations: Secondary | ICD-10-CM | POA: Diagnosis not present

## 2024-04-24 NOTE — Progress Notes (Signed)
 Maternal-Fetal Medicine Consultation Name: Hertha Gergen MRN: [id number]  G3 P1011 at 26w 6d gestation.  Patient return for fetal growth assessment. Pregravid BMI 48. She does not have gestational diabetes. Obstetrical history significant for a term vaginal delivery.  Her pregnancy was complicated by gestational hypertension.  She takes low-dose aspirin  prophylaxis.  Ultrasound Fetal growth is appropriate for gestational age.  Amniotic fluid is normal good fetal activity seen.  I reassured the patient of the findings. Grade 3 obesity is associated with a small increase in risk of stillbirth (2.5 to 3-fold), but the absolute risk is very small.  I discussed the significance of weekly antenatal testing that should be given from [redacted] weeks gestation.  History of gestational hypertension associated with recurrence risk and 25 to 50% of cases.  I discussed the benefit of low-dose aspirin  prophylaxis that delays or prevents preeclampsia.  Recommendations - An appointment was made for her to return in 6 weeks for fetal growth assessment and BPP. - Weekly BPP from [redacted] weeks gestation until delivery. Consultation including face-to-face (more than 50%) counseling 20 minutes.

## 2024-04-30 ENCOUNTER — Encounter: Payer: Self-pay | Admitting: Family Medicine

## 2024-04-30 ENCOUNTER — Ambulatory Visit (INDEPENDENT_AMBULATORY_CARE_PROVIDER_SITE_OTHER): Admitting: Obstetrics and Gynecology

## 2024-04-30 ENCOUNTER — Ambulatory Visit: Admitting: Family Medicine

## 2024-04-30 VITALS — BP 128/78 | HR 89 | Temp 97.6°F | Ht 66.0 in | Wt 312.0 lb

## 2024-04-30 VITALS — BP 135/84 | HR 96 | Wt 319.0 lb

## 2024-04-30 DIAGNOSIS — Z8759 Personal history of other complications of pregnancy, childbirth and the puerperium: Secondary | ICD-10-CM | POA: Diagnosis not present

## 2024-04-30 DIAGNOSIS — O0990 Supervision of high risk pregnancy, unspecified, unspecified trimester: Secondary | ICD-10-CM

## 2024-04-30 DIAGNOSIS — Z7189 Other specified counseling: Secondary | ICD-10-CM

## 2024-04-30 DIAGNOSIS — Z6841 Body Mass Index (BMI) 40.0 and over, adult: Secondary | ICD-10-CM

## 2024-04-30 DIAGNOSIS — Z3A27 27 weeks gestation of pregnancy: Secondary | ICD-10-CM

## 2024-04-30 DIAGNOSIS — O99210 Obesity complicating pregnancy, unspecified trimester: Secondary | ICD-10-CM

## 2024-04-30 DIAGNOSIS — Z7689 Persons encountering health services in other specified circumstances: Secondary | ICD-10-CM | POA: Insufficient documentation

## 2024-04-30 NOTE — Progress Notes (Addendum)
 New Patient Office Visit  Introduced to nurse practitioner role and practice setting.  All questions answered.  Discussed provider/patient relationship and expectations.   Subjective    Patient ID: Pamela Munoz, female    DOB: [birth year]  Age: 28 y.o. MRN: [id number]  CC:  Chief Complaint  Patient presents with   Establish Care    No current issues just wanted to be established    HPI Pamela Munoz presents to establish care. She has no major concerns today, she would just like to have PCP for future health concerns.  No major PMH, other than she is currently [redacted]w[redacted]d pregnant and has a 59 month old son. Hx of knee surgery from R ACL tear.   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 She has a brother who just got married last weekend.   She is currently being managed by OB/GYN for pregnancy care.   Discussed the use of AI scribe software for clinical note transcription with the patient, who gave verbal consent to proceed.   Outpatient Encounter Medications as of 04/30/2024  Medication Sig   aspirin  EC 81 MG tablet Take 2 tablets (162 mg total) by mouth daily. Take after 12 weeks for prevention of preeclampsia later in pregnancy   Prenatal Vit-Fe Fumarate-FA (MULTIVITAMIN-PRENATAL) 27-0.8 MG TABS tablet Take 1 tablet by mouth daily at 12 noon.   doxylamine , Sleep, (UNISOM ) 25 MG tablet Take 1 tablet (25 mg total) by mouth 2 (two) times daily as needed (nausea and vomiting). (Patient not taking: Reported on 04/16/2024)   pyridOXINE (VITAMIN B6) 25 MG tablet Take 1 tablet (25 mg total) by mouth 2 (two) times daily as needed (nausea and vomiting). (Patient not taking: Reported on 04/16/2024)   No facility-administered encounter medications on file as of 04/30/2024.    Past Medical History:  Diagnosis Date   Blood transfusion without reported diagnosis    At delivery   Hypertension    During pregnancy   Morbid obesity with BMI of 40.0-44.9, adult [facility])     Osteochondroma 2011   left leg    Past Surgical History:  Procedure Laterality Date   ANTERIOR CRUCIATE LIGAMENT REPAIR Right 08/03/2017   Procedure: ANTERIOR CRUCIATE LIGAMENT (ACL) REPAIR;  Surgeon: Lorri Rota, MD;  Location: [facility];  Service: Orthopedics;  Laterality: Right;   KNEE ARTHROSCOPY Right 08/03/2017   Procedure: ARTHROSCOPY KNEERIGHT MEDIAL MENISCUS REPAIR;  Surgeon: Lorri Rota, MD;  Location: [facility];  Service: Orthopedics;  Laterality: Right;  MTF CANNULATED DOWELS- WES CONN STRYKER VERSITOMIC REAMERS +/- INJECTABLE MAP3 [place] FRYE ARTHREX DILATORS CORING REAMERS +/- DBM ALEX ROBERTS BIOMET REMOVAL DEVICE (POSSIBLE) (UNKNOWN REP-TRYING TO FIND OUT 70 DEGREE ARTHROSCOPE   KNEE ARTHROSCOPY WITH ANTERIOR CRUCIATE LIGAMENT (ACL) REPAIR Right    KNEE SURGERY Left    bone spur   TONSILLECTOMY     WISDOM TOOTH EXTRACTION      Family History  Problem Relation Age of Onset   Endometriosis Mother    Diabetes Maternal Grandmother    Diabetes Maternal Grandfather     Social History   Socioeconomic History   Marital status: Married    Spouse name: Tejal Monroy   Number of children: Not on file   Years of education: Not on file   Highest education level: Not on file  Occupational History   Occupation: Pharmacy Tech  Tobacco Use   Smoking status: Never    Passive exposure: Never  Smokeless tobacco: Never  Vaping Use   Vaping status: Never Used  Substance and Sexual Activity   Alcohol use: No   Drug use: No   Sexual activity: Yes    Partners: Male    Birth control/protection: None    Comment: Pregnant  Other Topics Concern   Not on file  Social History Narrative   ** Merged History Encounter **       Social Drivers of Health   Financial Resource Strain: Not on file  Food Insecurity: No Food Insecurity (04/30/2024)   Hunger Vital Sign    Worried About Running Out of Food in the Last Year: Never true    Ran Out of Food in the  Last Year: Never true  Transportation Needs: No Transportation Needs (04/30/2024)   PRAPARE - Administrator, Civil Service (Medical): No    Lack of Transportation (Non-Medical): No  Physical Activity: Inactive (04/30/2024)   Exercise Vital Sign    Days of Exercise per Week: 0 days    Minutes of Exercise per Session: 0 min  Stress: No Stress Concern Present (04/30/2024)   [employer] of Occupational Health - Occupational Stress Questionnaire    Feeling of Stress: Not at all  Social Connections: Moderately Integrated (04/30/2024)   Social Connection and Isolation Panel    Frequency of Communication with Friends and Family: More than three times a week    Frequency of Social Gatherings with Friends and Family: More than three times a week    Attends Religious Services: More than 4 times per year    Active Member of [employer] or Organizations: No    Attends Banker Meetings: Never    Marital Status: Married  Catering manager Violence: Not At Risk (04/30/2024)   Humiliation, Afraid, Rape, and Kick questionnaire    Fear of Current or Ex-Partner: No    Emotionally Abused: No    Physically Abused: No    Sexually Abused: No    Review of Systems  All other systems reviewed and are negative.       Objective    BP 128/78 (BP Location: Left Arm, Patient Position: Sitting, Cuff Size: Large)   Pulse 89   Temp 97.6 F (36.4 C) (Oral)   Ht 5' 6 (1.676 m)   Wt (!) 312 lb (141.5 kg)   LMP 10/08/2023   SpO2 97%   BMI 50.36 kg/m   Physical Exam Constitutional:      General: She is not in acute distress.    Appearance: Normal appearance. She is obese. She is not ill-appearing, toxic-appearing or diaphoretic.  HENT:     Nose: No congestion.   Eyes:     Pupils: Pupils are equal, round, and reactive to light.   Neck:     Vascular: No carotid bruit.   Cardiovascular:     Rate and Rhythm: Normal rate and regular rhythm.     Pulses: Normal pulses.     Heart  sounds: Normal heart sounds. No murmur heard.    No friction rub. No gallop.  Pulmonary:     Effort: Pulmonary effort is normal. No respiratory distress.     Breath sounds: Normal breath sounds. No stridor. No wheezing, rhonchi or rales.  Chest:     Chest wall: No tenderness.   Musculoskeletal:        General: Normal range of motion.     Cervical back: Normal range of motion and neck supple. No rigidity or tenderness.  Lymphadenopathy:  Cervical: No cervical adenopathy.   Skin:    General: Skin is warm and dry.     Capillary Refill: Capillary refill takes less than 2 seconds.   Neurological:     General: No focal deficit present.     Mental Status: She is alert and oriented to person, place, and time. Mental status is at baseline.   Psychiatric:        Attention and Perception: Attention and perception normal.        Mood and Affect: Mood and affect normal.        Speech: Speech normal.        Behavior: Behavior normal. Behavior is cooperative.        Thought Content: Thought content normal.        Cognition and Memory: Cognition and memory normal.        Judgment: Judgment normal.         Assessment & Plan:   Obesity in pregnancy Assessment & Plan: Recommend well balanced healthy diet Encourage daily walking to have purposeful movement   Encounter to establish care with new doctor Assessment & Plan: Welcome to [facility] No concerns today She feels generally good now that nausea and fatigue have gotten better from pregancy Managed by ob/gyn - appreciate there care No labs due at this time - all recent within past 4 months Plan to f/u post 6 week postpartum, in about 5-6 months for general physical   [redacted] weeks gestation of pregnancy Assessment & Plan: Managed by OB/Gyn Due 07/25/2024 No concerns today Continue to be follow up with OB   History of gestational hypertension Assessment & Plan: Does not recall ever taking medications for gestational HTN SBP  elevated today in office, but otherwise no concerns Denies headaches, visison changes, chest pain, or urination issues.  Recommend weekly monitoring at home Well balanced diet Keep up general tolerated physical activity during pregancy    Return in about 6 months (around 10/30/2024) for annual physical.   I, Tasia Farr, FNP, have reviewed all documentation for this visit. The documentation on 04/30/24 for the exam, diagnosis, procedures, and orders are all accurate and complete.   Tasia Farr, FNP

## 2024-04-30 NOTE — Progress Notes (Signed)
 ROB: Bilateral lower extremity edema, Right worse than left

## 2024-04-30 NOTE — Progress Notes (Signed)
   PRENATAL VISIT NOTE  Subjective:  Pamela Munoz is a 28 y.o. G3P1011 at [redacted]w[redacted]d being seen today for ongoing prenatal care.  She is currently monitored for the following issues for this high-risk pregnancy and has Obesity in pregnancy; History of gestational hypertension; Supervision of high risk pregnancy, antepartum; BMI 45.0-49.9, adult (HCC); History of torn meniscus of left knee; Encounter to establish care with new doctor; and [redacted] weeks gestation of pregnancy on their problem list.  Patient reports LE swelling, R > L for past week. Comes and goes, better with elevation and rest. Has hx meniscus/MCL tear on L side. No pain. Swelling not as severe as last pregnancy. Otherwise doing well  Contractions: Not present. Vag. Bleeding: None.  Movement: Present. Denies leaking of fluid.   The following portions of the patient's history were reviewed and updated as appropriate: allergies, current medications, past family history, past medical history, past social history, past surgical history and problem list.   Objective:   Vitals:   04/30/24 1331  BP: 135/84  Pulse: 96  Weight: (!) 319 lb (144.7 kg)   Fetal Status: Fetal Heart Rate (bpm): 141   Movement: Present     General:  Alert, oriented and cooperative. Patient is in no acute distress.  Skin: Skin is warm and dry. No rash noted.   Cardiovascular: Normal heart rate noted  Respiratory: Normal respiratory effort, no problems with respiration noted  Abdomen: Soft, gravid, appropriate for gestational age.  Pain/Pressure: Absent     Extremities - mild non pitting peripheral edema, very slightly more noticeable on the R side. No calf tenderness, negative Homans sign. No skin changes or erythema.   Assessment and Plan:  Pregnancy: G3P1011 at [redacted]w[redacted]d 1. Supervision of high risk pregnancy, antepartum (Primary) 2. [redacted] weeks gestation of pregnancy CBC, 2h, HIV/RPR normal last visit Discussed tdap, she is considering for next appt Low c/f VTE  on exam today, but reviewing warning si/sx with patient and when to seek care Declines postpartum contraception, wants more children. Discussed optimal birth spacing for prevention of PTB Pediatrician: [facility] Planning circumcision Planning to breast feed  3. History of gestational hypertension Normotensive today, normal baseline labs ldASA  4. BMI 50.0-59.9, adult (HCC) Growth 6/11 @ 26/6 1149g (81%) ldASA Serial growth US , weekly BPP at 34 weeks  Please refer to After Visit Summary for other counseling recommendations.   Return in about 2 weeks (around 05/14/2024) for return OB at 29 weeks.  Future Appointments  Date Time Provider Department Center  05/14/2024  1:30 PM Anyanwu, Kathrine Paris, MD [facility]  05/28/2024  1:30 PM Anyanwu, Kathrine Paris, MD [facility]  06/05/2024 11:00 AM [facility] PROVIDER 1 [facility]  06/05/2024 11:30 AM [facility] US4 [facility]  06/11/2024  1:30 PM Anyanwu, Kathrine Paris, MD [facility]  06/26/2024 11:00 AM [facility] PROVIDER 1 [facility]  06/26/2024 11:30 AM [facility] US1 [facility]  07/03/2024 11:00 AM [facility] PROVIDER 1 [facility]  07/03/2024 11:30 AM [facility] US2 [facility]  10/30/2024 11:00 AM Tasia Farr, FNP [facility]   Izell Marsh, MD

## 2024-04-30 NOTE — Assessment & Plan Note (Addendum)
 Welcome to [facility] No concerns today She feels generally good now that nausea and fatigue have gotten better from pregancy Managed by ob/gyn - appreciate there care No labs due at this time - all recent within past 4 months Plan to f/u post 6 week postpartum, in about 5-6 months for general physical

## 2024-04-30 NOTE — Assessment & Plan Note (Addendum)
 Does not recall ever taking medications for gestational HTN SBP elevated today in office, but otherwise no concerns Denies headaches, visison changes, chest pain, or urination issues.  Recommend weekly monitoring at home Well balanced diet Keep up general tolerated physical activity during pregancy

## 2024-04-30 NOTE — Assessment & Plan Note (Signed)
 Recommend well balanced healthy diet Encourage daily walking to have purposeful movement

## 2024-04-30 NOTE — Assessment & Plan Note (Addendum)
 Managed by OB/Gyn Due 07/25/2024 No concerns today Continue to be follow up with OB

## 2024-04-30 NOTE — Progress Notes (Deleted)
 New Patient Office Visit  Subjective    Patient ID: Pamela Munoz, female    DOB: 16-Apr-1996  Age: 28 y.o. MRN: [id number]  CC:  Chief Complaint  Patient presents with   Establish Care    No current issues just wanted to be established    HPI Pamela Munoz presents to establish care *** Crochet, strangers things,  9 month yo, 28 weeks Met in instagram From [place]  Outpatient Encounter Medications as of 04/30/2024  Medication Sig   aspirin  EC 81 MG tablet Take 2 tablets (162 mg total) by mouth daily. Take after 12 weeks for prevention of preeclampsia later in pregnancy   Prenatal Vit-Fe Fumarate-FA (MULTIVITAMIN-PRENATAL) 27-0.8 MG TABS tablet Take 1 tablet by mouth daily at 12 noon.   doxylamine , Sleep, (UNISOM ) 25 MG tablet Take 1 tablet (25 mg total) by mouth 2 (two) times daily as needed (nausea and vomiting). (Patient not taking: Reported on 04/16/2024)   pyridOXINE (VITAMIN B6) 25 MG tablet Take 1 tablet (25 mg total) by mouth 2 (two) times daily as needed (nausea and vomiting). (Patient not taking: Reported on 04/16/2024)   No facility-administered encounter medications on file as of 04/30/2024.    Past Medical History:  Diagnosis Date   Blood transfusion without reported diagnosis    At delivery   Hypertension    During pregnancy   Morbid obesity with BMI of 40.0-44.9, adult [facility])    Osteochondroma 2011   left leg    Past Surgical History:  Procedure Laterality Date   ANTERIOR CRUCIATE LIGAMENT REPAIR Right 08/03/2017   Procedure: ANTERIOR CRUCIATE LIGAMENT (ACL) REPAIR;  Surgeon: Lorri Rota, MD;  Location: [facility];  Service: Orthopedics;  Laterality: Right;   KNEE ARTHROSCOPY Right 08/03/2017   Procedure: ARTHROSCOPY KNEERIGHT MEDIAL MENISCUS REPAIR;  Surgeon: Lorri Rota, MD;  Location: [facility];  Service: Orthopedics;  Laterality: Right;  MTF CANNULATED DOWELS- WES CONN STRYKER VERSITOMIC REAMERS +/- INJECTABLE MAP3 [place] FRYE ARTHREX  DILATORS CORING REAMERS +/- DBM ALEX ROBERTS BIOMET REMOVAL DEVICE (POSSIBLE) (UNKNOWN REP-TRYING TO FIND OUT 70 DEGREE ARTHROSCOPE   KNEE ARTHROSCOPY WITH ANTERIOR CRUCIATE LIGAMENT (ACL) REPAIR Right    KNEE SURGERY Left    bone spur   TONSILLECTOMY     WISDOM TOOTH EXTRACTION      Family History  Problem Relation Age of Onset   Endometriosis Mother    Diabetes Maternal Grandmother    Diabetes Maternal Grandfather     Social History   Socioeconomic History   Marital status: Married    Spouse name: Lesta Limbert   Number of children: Not on file   Years of education: Not on file   Highest education level: Not on file  Occupational History   Occupation: Pharmacy Tech  Tobacco Use   Smoking status: Never    Passive exposure: Never   Smokeless tobacco: Never  Vaping Use   Vaping status: Never Used  Substance and Sexual Activity   Alcohol use: No   Drug use: No   Sexual activity: Yes    Partners: Male    Birth control/protection: None    Comment: Pregnant  Other Topics Concern   Not on file  Social History Narrative   ** Merged History Encounter **       Social Drivers of Health   Financial Resource Strain: Not on file  Food Insecurity: No Food Insecurity (04/30/2024)   Hunger Vital Sign    Worried About Running Out of Food in  the Last Year: Never true    Ran Out of Food in the Last Year: Never true  Transportation Needs: No Transportation Needs (04/30/2024)   PRAPARE - Administrator, Civil Service (Medical): No    Lack of Transportation (Non-Medical): No  Physical Activity: Inactive (04/30/2024)   Exercise Vital Sign    Days of Exercise per Week: 0 days    Minutes of Exercise per Session: 0 min  Stress: No Stress Concern Present (04/30/2024)   [employer] of Occupational Health - Occupational Stress Questionnaire    Feeling of Stress: Not at all  Social Connections: Moderately Integrated (04/30/2024)   Social Connection and Isolation Panel     Frequency of Communication with Friends and Family: More than three times a week    Frequency of Social Gatherings with Friends and Family: More than three times a week    Attends Religious Services: More than 4 times per year    Active Member of [employer] or Organizations: No    Attends Banker Meetings: Never    Marital Status: Married  Catering manager Violence: Not At Risk (04/30/2024)   Humiliation, Afraid, Rape, and Kick questionnaire    Fear of Current or Ex-Partner: No    Emotionally Abused: No    Physically Abused: No    Sexually Abused: No    ROS      Objective    BP 128/78 (BP Location: Left Arm, Patient Position: Sitting, Cuff Size: Large)   Pulse 89   Temp 97.6 F (36.4 C) (Oral)   Ht 5' 6 (1.676 m)   Wt (!) 312 lb (141.5 kg)   LMP 10/08/2023   SpO2 97%   BMI 50.36 kg/m   Physical Exam  {Labs (Optional):23779}    Assessment & Plan:   There are no diagnoses linked to this encounter.  Return in about 6 months (around 10/30/2024) for annual physical.   Tasia Farr, FNP

## 2024-05-14 ENCOUNTER — Encounter: Payer: Self-pay | Admitting: Obstetrics & Gynecology

## 2024-05-14 ENCOUNTER — Ambulatory Visit (INDEPENDENT_AMBULATORY_CARE_PROVIDER_SITE_OTHER): Admitting: Obstetrics & Gynecology

## 2024-05-14 VITALS — BP 128/84 | HR 89 | Wt 314.0 lb

## 2024-05-14 DIAGNOSIS — O0990 Supervision of high risk pregnancy, unspecified, unspecified trimester: Secondary | ICD-10-CM

## 2024-05-14 DIAGNOSIS — Z3A29 29 weeks gestation of pregnancy: Secondary | ICD-10-CM | POA: Diagnosis not present

## 2024-05-14 DIAGNOSIS — O99210 Obesity complicating pregnancy, unspecified trimester: Secondary | ICD-10-CM | POA: Diagnosis not present

## 2024-05-14 NOTE — Patient Instructions (Signed)

## 2024-05-14 NOTE — Progress Notes (Signed)
 PRENATAL VISIT NOTE  Subjective:  Pamela Munoz is a 28 y.o. G3P1011 at [redacted]w[redacted]d being seen today for ongoing prenatal care.  She is currently monitored for the following issues for this high-risk pregnancy and has Maternal morbid obesity, antepartum (HCC); History of gestational hypertension; Supervision of high risk pregnancy, antepartum; BMI 45.0-49.9, adult (HCC); and History of torn meniscus of left knee on their problem list.  Patient reports having watery discharge, wants to make sure it is not amniotic fluid  No irritation.  Contractions: Not present. Vag. Bleeding: None.  Movement: Present. Denies leaking of fluid.   The following portions of the patient's history were reviewed and updated as appropriate: allergies, current medications, past family history, past medical history, past social history, past surgical history and problem list.   Objective:    Vitals:   05/14/24 1331  BP: 128/84  Pulse: 89  Weight: (!) 314 lb (142.4 kg)    Fetal Status:  Fetal Heart Rate (bpm): 150   Movement: Present    General: Alert, oriented and cooperative. Patient is in no acute distress.  Skin: Skin is warm and dry. No rash noted.   Cardiovascular: Normal heart rate noted  Respiratory: Normal respiratory effort, no problems with respiration noted  Abdomen: Soft, gravid, appropriate for gestational age.  Pain/Pressure: Present     Pelvic: Sterile speculum exam performed in the presence of a chaperone. No pooling, scant clear-white discharge seen.  Amniotest negative. Dilation: Closed Effacement (%): Thick Station: Ballotable  Extremities: Normal range of motion.  Edema: None  Mental Status: Normal mood and affect. Normal behavior. Normal judgment and thought content.   US  MFM OB FOLLOW UP Result Date: 04/24/2024 ----------------------------------------------------------------------  OBSTETRICS REPORT                       (Signed Final 04/24/2024 11:45 am)  ---------------------------------------------------------------------- Patient Info  ID #:       [id number]                          D.O.B.:  Jun 03, 1996 (27 yrs)(F)  Name:       Pamela Munoz                 Visit Date: 04/24/2024 09:27 am ---------------------------------------------------------------------- Performed By  Attending:        Fredia Fresh MD        Ref. Address:     [address]                                                             [address]  Performed By:     Nat GORMAN Plant     [facility], [facility]  Referred By:      [facility] ---------------------------------------------------------------------- Orders  #  Description                           Code        Ordered By  1  US  MFM OB FOLLOW UP                   [phone]    BABARA KEYS ----------------------------------------------------------------------  #  Order #                     Accession #                Episode #  1  [id number]                   [id number]                 [id number] ---------------------------------------------------------------------- Indications  Obesity complicating pregnancy, second         O99.212  trimester (BMI 48)  Poor obstetric history: Previous               O09.299  preeclampsia / eclampsia/gestational HTN  [redacted] weeks gestation of pregnancy                Z3A.26  Encounter for antenatal screening for          Z36.3  malformations  LR NIPS/ Neg [place] (2023)  Passed GTT 04-16-24 ---------------------------------------------------------------------- Vital Signs  BP:          121/63 ---------------------------------------------------------------------- Fetal Evaluation  Num Of Fetuses:         1  Fetal Heart Rate(bpm):  132  Cardiac Activity:       Observed  Presentation:            Cephalic  Placenta:               Posterior Left Lateral  P. Cord Insertion:      Previously seen as normal  Amniotic Fluid  AFI FV:      Within normal limits                              Largest Pocket(cm)                              7.06 ---------------------------------------------------------------------- Biometry  BPD:      72.8  mm     G. Age:  29w 1d         96  %    CI:        75.89   %    70 - 86  FL/HC:      18.7   %    18.6 - 20.4  HC:      264.9  mm     G. Age:  28w 6d         84  %    HC/AC:      1.10        1.05 - 1.21  AC:      241.6  mm     G. Age:  28w 3d         85  %    FL/BPD:     68.0   %    71 - 87  FL:       49.5  mm     G. Age:  26w 5d         30  %    FL/AC:      20.5   %    20 - 24  HUM:      46.1  mm     G. Age:  27w 1d         54  %  LV:        5.3  mm  Est. FW:    1149  gm      2 lb 9 oz     81  % ---------------------------------------------------------------------- OB History  Blood Type:   O+  Gravidity:    3         Term:   1        Prem:   0        SAB:   1  TOP:          0       Ectopic:  0        Living: 1 ---------------------------------------------------------------------- Gestational Age  LMP:           28w 3d        Date:  10/08/23                 EDD:   07/14/24  U/S Today:     28w 2d                                        EDD:   07/15/24  Best:          26w 6d     Det. By:  Early Ultrasound         EDD:   07/25/24                                      (12/05/23) ---------------------------------------------------------------------- Anatomy  Ventricles:            Appears normal         Diaphragm:              Appears normal  Heart:                 Appears normal         Stomach:                Appears normal, left                         (4CH, axis, and  sided                         situs)  RVOT:                  Appears normal         Kidneys:                Appear normal  LVOT:                   Appears normal         Bladder:                Appears normal ---------------------------------------------------------------------- Impression  G3 P1011 at 26w 6d gestation.  Patient return for fetal growth  assessment.  Pregravid BMI 48.  She does not have gestational diabetes.  Obstetrical history significant for a term vaginal delivery.  Her  pregnancy was complicated by gestational hypertension.  She takes low-dose aspirin  prophylaxis.  Ultrasound  Fetal growth is appropriate for gestational age.  Amniotic fluid  is normal good fetal activity seen.  I reassured the patient of the findings.  Grade 3 obesity is associated with a small increase in risk of  stillbirth (2.5 to 3-fold), but the absolute risk is very small.  I  discussed the significance of weekly antenatal testing that  should be given from [redacted] weeks gestation.  History of gestational hypertension associated with  recurrence risk and 25 to 50% of cases.  I discussed the  benefit of low-dose aspirin  prophylaxis that delays or prevents  preeclampsia. ---------------------------------------------------------------------- Recommendations  - An appointment was made for her to return in 6 weeks for  fetal growth assessment and BPP.  - Weekly BPP from [redacted] weeks gestation until delivery. ----------------------------------------------------------------------                 Fredia Fresh, MD Electronically Signed Final Report   04/24/2024 11:45 am ----------------------------------------------------------------------    Assessment and Plan:  Pregnancy: G3P1011 at [redacted]w[redacted]d 1. Maternal morbid obesity, antepartum (HCC) (Primary) Follow up growth and antenatal testing as per MFM.  2. [redacted] weeks gestation of pregnancy 3. Supervision of high risk pregnancy, antepartum Normal third trimester labs. Declines Tdap. Preterm labor symptoms and general obstetric precautions including but not limited to vaginal bleeding, contractions, leaking of fluid and fetal  movement were reviewed in detail with the patient. Please refer to After Visit Summary for other counseling recommendations.   Return in about 2 weeks (around 05/28/2024) for OFFICE OB VISIT (MD only).  Future Appointments  Date Time Provider Department Center  05/28/2024  1:30 PM Herchel Gloris LABOR, MD [facility]  06/05/2024 11:00 AM [facility] PROVIDER 1 [facility]  06/05/2024 11:30 AM [facility] US4 [facility]  06/11/2024  1:30 PM Danne Vasek, Gloris LABOR, MD [facility]  06/26/2024 11:00 AM [facility] PROVIDER 1 [facility]  06/26/2024 11:30 AM [facility] US1 [facility]  07/03/2024 11:00 AM [facility] PROVIDER 1 [facility]  07/03/2024 11:30 AM [facility] US2 [facility]  10/30/2024 11:00 AM [place], Curtis LABOR, FNP [facility]    Gloris Herchel, MD

## 2024-05-16 ENCOUNTER — Other Ambulatory Visit: Payer: Self-pay | Admitting: *Deleted

## 2024-05-16 NOTE — Progress Notes (Signed)
 erroneous

## 2024-05-28 ENCOUNTER — Ambulatory Visit (INDEPENDENT_AMBULATORY_CARE_PROVIDER_SITE_OTHER): Admitting: Obstetrics & Gynecology

## 2024-05-28 ENCOUNTER — Encounter: Payer: Self-pay | Admitting: Obstetrics & Gynecology

## 2024-05-28 VITALS — BP 131/84 | HR 111 | Wt 316.0 lb

## 2024-05-28 DIAGNOSIS — O99210 Obesity complicating pregnancy, unspecified trimester: Secondary | ICD-10-CM

## 2024-05-28 DIAGNOSIS — Z8759 Personal history of other complications of pregnancy, childbirth and the puerperium: Secondary | ICD-10-CM | POA: Diagnosis not present

## 2024-05-28 DIAGNOSIS — Z3A31 31 weeks gestation of pregnancy: Secondary | ICD-10-CM

## 2024-05-28 DIAGNOSIS — O0990 Supervision of high risk pregnancy, unspecified, unspecified trimester: Secondary | ICD-10-CM

## 2024-05-28 NOTE — Progress Notes (Signed)
 PRENATAL VISIT NOTE  Subjective:  Pamela Munoz is a 28 y.o. G3P1011 at [redacted]w[redacted]d being seen today for ongoing prenatal care.  She is currently monitored for the following issues for this high-risk pregnancy and has Maternal morbid obesity, antepartum (HCC); History of gestational hypertension; Supervision of high risk pregnancy, antepartum; BMI 45.0-49.9, adult (HCC); and History of torn meniscus of left knee on their problem list.  Patient reports no complaints.  Contractions: Not present. Vag. Bleeding: None.  Movement: Present. Denies leaking of fluid.   The following portions of the patient's history were reviewed and updated as appropriate: allergies, current medications, past family history, past medical history, past social history, past surgical history and problem list.   Objective:    Vitals:   05/28/24 1338  BP: 131/84  Pulse: (!) 111  Weight: (!) 316 lb (143.3 kg)    Fetal Status:  Fetal Heart Rate (bpm): 135   Movement: Present    General: Alert, oriented and cooperative. Patient is in no acute distress.  Skin: Skin is warm and dry. No rash noted.   Cardiovascular: Normal heart rate noted  Respiratory: Normal respiratory effort, no problems with respiration noted  Abdomen: Soft, gravid, appropriate for gestational age.  Pain/Pressure: Absent     Pelvic: Cervical exam deferred        Extremities: Normal range of motion.     Mental Status: Normal mood and affect. Normal behavior. Normal judgment and thought content.   US  MFM OB FOLLOW UP Result Date: 04/24/2024 ----------------------------------------------------------------------  OBSTETRICS REPORT                       (Signed Final 04/24/2024 11:45 am) ---------------------------------------------------------------------- Patient Info  ID #:       [id number]                          D.O.B.:  10-06-1996 (27 yrs)(F)  Name:       Pamela Munoz                 Visit Date: 04/24/2024 09:27 am  ---------------------------------------------------------------------- Performed By  Attending:        Fredia Fresh MD        Ref. Address:     [address]                                                             [address]  Performed By:     Nat GORMAN Plant     [facility]                    BS, RDMS                                 [facility]  Referred By:      [facility] ---------------------------------------------------------------------- Orders  #  Description                           Code        Ordered By  1  US  MFM OB FOLLOW UP                   [id number]    BABARA KEYS ----------------------------------------------------------------------  #  Order #                     Accession #                Episode #  1  [id number]                   [id number]                 [id number] ---------------------------------------------------------------------- Indications  Obesity complicating pregnancy, second         O99.212  trimester (BMI 48)  Poor obstetric history: Previous               O09.299  preeclampsia / eclampsia/gestational HTN  [redacted] weeks gestation of pregnancy                Z3A.26  Encounter for antenatal screening for          Z36.3  malformations  LR NIPS/ Neg Horizon (2023)  Passed GTT 04-16-24 ---------------------------------------------------------------------- Vital Signs  BP:          121/63 ---------------------------------------------------------------------- Fetal Evaluation  Num Of Fetuses:         1  Fetal Heart Rate(bpm):  132  Cardiac Activity:       Observed  Presentation:           Cephalic  Placenta:               Posterior Left Lateral  P. Cord Insertion:      Previously seen as normal  Amniotic Fluid  AFI FV:      Within normal limits                              Largest Pocket(cm)                              7.06  ---------------------------------------------------------------------- Biometry  BPD:      72.8  mm     G. Age:  29w 1d         96  %    CI:        75.89   %    70 - 86  FL/HC:      18.7   %    18.6 - 20.4  HC:      264.9  mm     G. Age:  28w 6d         84  %    HC/AC:      1.10        1.05 - 1.21  AC:      241.6  mm     G. Age:  28w 3d         85  %    FL/BPD:     68.0   %    71 - 87  FL:       49.5  mm     G. Age:  26w 5d         30  %    FL/AC:      20.5   %    20 - 24  HUM:      46.1  mm     G. Age:  27w 1d         54  %  LV:        5.3  mm  Est. FW:    1149  gm      2 lb 9 oz     81  % ---------------------------------------------------------------------- OB History  Blood Type:   O+  Gravidity:    3         Term:   1        Prem:   0        SAB:   1  TOP:          0       Ectopic:  0        Living: 1 ---------------------------------------------------------------------- Gestational Age  LMP:           28w 3d        Date:  10/08/23                 EDD:   07/14/24  U/S Today:     28w 2d                                        EDD:   07/15/24  Best:          26w 6d     Det. By:  Early Ultrasound         EDD:   07/25/24                                      (12/05/23) ---------------------------------------------------------------------- Anatomy  Ventricles:            Appears normal         Diaphragm:              Appears normal  Heart:                 Appears normal         Stomach:                Appears normal, left                         (4CH, axis, and  sided                         situs)  RVOT:                  Appears normal         Kidneys:                Appear normal  LVOT:                  Appears normal         Bladder:                Appears normal ---------------------------------------------------------------------- Impression  G3 P1011 at 26w 6d gestation.  Patient return for fetal growth  assessment.  Pregravid BMI  48.  She does not have gestational diabetes.  Obstetrical history significant for a term vaginal delivery.  Her  pregnancy was complicated by gestational hypertension.  She takes low-dose aspirin  prophylaxis.  Ultrasound  Fetal growth is appropriate for gestational age.  Amniotic fluid  is normal good fetal activity seen.  I reassured the patient of the findings.  Grade 3 obesity is associated with a small increase in risk of  stillbirth (2.5 to 3-fold), but the absolute risk is very small.  I  discussed the significance of weekly antenatal testing that  should be given from [redacted] weeks gestation.  History of gestational hypertension associated with  recurrence risk and 25 to 50% of cases.  I discussed the  benefit of low-dose aspirin  prophylaxis that delays or prevents  preeclampsia. ---------------------------------------------------------------------- Recommendations  - An appointment was made for her to return in 6 weeks for  fetal growth assessment and BPP.  - Weekly BPP from [redacted] weeks gestation until delivery. ----------------------------------------------------------------------                 Fredia Fresh, MD Electronically Signed Final Report   04/24/2024 11:45 am ----------------------------------------------------------------------   US  MFM OB DETAIL +14 WK Result Date: 03/20/2024 ----------------------------------------------------------------------  OBSTETRICS REPORT                       (Signed Final 03/20/2024 11:32 am) ---------------------------------------------------------------------- Patient Info  ID #:       [id number]                          D.O.B.:  [birth year] (27 yrs)(F)  Name:       Pamela Munoz                 Visit Date: 03/20/2024 09:03 am ---------------------------------------------------------------------- Performed By  Attending:        Steffan Keys MD         Ref. Address:     [address]                                                             [address]  Performed By:     Emelia Coombs BS,       Location:         [facility]                    RDMS, RVT  [facility] at                                                             [facility]  Referred By:      [facility] ---------------------------------------------------------------------- Orders  #  Description                           Code        Ordered By  1  US  MFM OB DETAIL +14 WK               [id number]    CHARLIE PICKENS ----------------------------------------------------------------------  #  Order #                     Accession #                Episode #  1  [id number]                   [id number]                 [id number] ---------------------------------------------------------------------- Indications  Obesity complicating pregnancy, second         O99.212  trimester (BMI 48)  Poor obstetric history: Previous               O09.299  preeclampsia / eclampsia/gestational HTN  [redacted] weeks gestation of pregnancy                Z3A.21  Encounter for antenatal screening for          Z36.3  malformations  LR NIPS/ Neg Horizon (2023) ---------------------------------------------------------------------- Fetal Evaluation  Num Of Fetuses:         1  Fetal Heart Rate(bpm):  141  Cardiac Activity:       Observed  Presentation:           Cephalic  Placenta:               Posterior Left lateral  P. Cord Insertion:      Visualized  Amniotic Fluid  AFI FV:      Within normal limits                              Largest Pocket(cm)                              6.93 ---------------------------------------------------------------------- Biometry  BPD:      55.4  mm     G. Age:  22w 6d         84  %    CI:         75.1   %    70 - 86  FL/HC:      18.2   %    18.4 - 20.2  HC:      202.8  mm     G. Age:  22w 3d         63  %    HC/AC:      1.11        1.06 - 1.25  AC:      182.3  mm     G.  Age:  23w 0d         80  %    FL/BPD:     66.8   %    71 - 87  FL:         37  mm     G. Age:  21w 5d         37  %    FL/AC:      20.3   %    20 - 24  HUM:      36.9  mm     G. Age:  22w 6d         75  %  CER:        24  mm     G. Age:  22w 1d         76  %  LV:          6  mm  Est. FW:     510  gm      1 lb 2 oz     77  % ---------------------------------------------------------------------- OB History  Blood Type:   O+  Gravidity:    3         Term:   1        Prem:   0        SAB:   1  TOP:          0       Ectopic:  0        Living: 1 ---------------------------------------------------------------------- Gestational Age  LMP:           23w 3d        Date:  10/08/23                 EDD:   07/14/24  U/S Today:     22w 4d                                        EDD:   07/20/24  Best:          21w 6d     Det. By:  Early Ultrasound         EDD:   07/25/24                                      (12/05/23) ---------------------------------------------------------------------- Targeted Anatomy  Central Nervous System  Calvarium/Cranial V.:  Appears normal         Cereb./Vermis:          Appears normal  Cavum:                 Appears normal         Cisterna Magna:         Appears normal  Lateral Ventricles:    Appears normal  Midline Falx:           Appears normal  Choroid Plexus:        Appears normal  Spine  Cervical:              Appears normal         Sacral:                 Appears normal  Thoracic:              Appears normal         Shape/Curvature:        Appears normal  Lumbar:                Appears normal  Head/Neck  Lips:                  Appears normal         Profile:                Appears normal  Neck:                  Appears normal         Orbits/Eyes:            Appears normal  Nuchal Fold:           Appears normal         Mandible:               Appears normal  Nasal Bone:            Present                Maxilla:                Appears normal  Thorax  4 Chamber View:        Appears normal          Interventr. Septum:     Appears normal  Cardiac Rhythm:        Normal                 Cardiac Axis:           Normal  Cardiac Situs:         Appears normal         Diaphragm:              Appears normal  Rt Outflow Tract:      Appears normal         3 Vessel View:          Appears normal  Lt Outflow Tract:      Appears normal         3 V Trachea View:       Appears normal  Aortic Arch:           Appears normal         IVC:                    Appears normal  Ductal Arch:           Appears normal         Crossing:               Appears normal  SVC:                   Appears normal  Abdomen  Ventral Wall:  Appears normal         Lt Kidney:              Appears normal  Cord Insertion:        Appears normal         Rt Kidney:              Appears normal  Situs:                 Appears normal         Bladder:                Appears normal  Stomach:               Appears normal  Extremities  Lt Humerus:            Appears normal         Lt Femur:               Appears normal  Rt Humerus:            Appears normal         Rt Femur:               Appears normal  Lt Forearm:            Appears normal         Lt Lower Leg:           Appears normal  Rt Forearm:            Appears normal         Rt Lower Leg:           Appears normal  Lt Hand:               Open hand nml          Lt Foot:                Nml heel/foot  Rt Hand:               Open hand nml          Rt Foot:                Nml heel/foot  Other  Umbilical Cord:        Normal 3-vessel        Genitalia:              Female-nml  Comment:     Technically difficult due to maternal habitus and fetal position. ---------------------------------------------------------------------- Cervix Uterus Adnexa  Cervix  Length:            4.5  cm.  Normal appearance by transabdominal scan  Uterus  No abnormality visualized.  Right Ovary  Within normal limits.  Left Ovary  Within normal limits.  Cul De Sac  No free fluid seen.  Adnexa  No abnormality visualized  ---------------------------------------------------------------------- Comments  Pamela Munoz is currently at 21 weeks and 6 days.  She  was seen due to maternal obesity with a BMI of 48.  Her last  pregnancy about a year and a half ago was complicated by  gestational hypertension.  She denies any problems in her current pregnancy.  Her  blood pressure today was 130/67.  She had a cell free DNA test earlier in her pregnancy which  indicated a low risk for trisomy 52, 77, and 13. A female fetus is  predicted.  She was informed  that the fetal growth and amniotic fluid  level were appropriate for her gestational age.  There were no obvious fetal anomalies noted on today's  ultrasound exam.  Today's exam was limited due to maternal  body habitus.  The patient was informed that anomalies may be missed due  to technical limitations. If the fetus is in a suboptimal position  or maternal habitus is increased, visualization of the fetus in  the maternal uterus may be impaired.  Due to maternal obesity, we will continue to follow her with  growth ultrasounds throughout her pregnancy.  Weekly fetal testing should be started at around 34 weeks.  The patient was advised to continue taking 2 tablets of baby  aspirin  daily for preeclampsia prophylaxis.  A follow-up exam was scheduled in 5 weeks.  She stated that all of her questions were answered today.  A total of 30 minutes was spent counseling and coordinating  the care for this patient.  Greater than 50% of the time was  spent in direct face-to-face contact. ----------------------------------------------------------------------                  Steffan Keys, MD Electronically Signed Final Report   03/20/2024 11:32 am ----------------------------------------------------------------------     Assessment and Plan:  Pregnancy: G3P1011 at [redacted]w[redacted]d 1. Maternal morbid obesity, antepartum (HCC) (Primary) Already scheduled for serial growth scans and BPPsas per MFM.  2. History of  gestational hypertension BPs 130/80s, will continue to monitor closely. Already on ASA 162 mg daily. Preeclampsia precautions reviewed.  3. [redacted] weeks gestation of pregnancy 4. Supervision of high risk pregnancy, antepartum No other concerns. Preterm labor symptoms and general obstetric precautions including but not limited to vaginal bleeding, contractions, leaking of fluid and fetal movement were reviewed in detail with the patient. Please refer to After Visit Summary for other counseling recommendations.   Return in about 2 weeks (around 06/11/2024) for OB VISIT (MD only) - can be in office or virtual.  Future Appointments  Date Time Provider Department Center  06/05/2024 11:00 AM [facility] PROVIDER 1 [facility]  06/05/2024 11:30 AM [facility] US4 [facility]  06/11/2024  1:30 PM Travarus Trudo, Gloris LABOR, MD [facility]  06/18/2024  1:10 PM [facility] NST [facility]  06/25/2024  2:10 PM [facility] NST [facility]  06/25/2024  2:30 PM Izell Harari, MD [facility]  07/02/2024  8:35 AM Herchel Gloris LABOR, MD [facility]  07/03/2024 11:00 AM [facility] PROVIDER 1 [facility]  07/03/2024 11:30 AM [facility] US2 [facility]  07/09/2024  9:15 AM [facility] NST [facility]  07/09/2024  9:35 AM Fredirick Glenys RAMAN, MD [facility]  10/30/2024 11:00 AM Wellington Curtis LABOR, FNP [facility]    Gloris Herchel, MD

## 2024-05-28 NOTE — Patient Instructions (Signed)

## 2024-06-05 ENCOUNTER — Ambulatory Visit: Attending: Obstetrics and Gynecology | Admitting: Obstetrics and Gynecology

## 2024-06-05 ENCOUNTER — Ambulatory Visit

## 2024-06-05 VITALS — BP 125/56 | HR 100

## 2024-06-05 DIAGNOSIS — O0990 Supervision of high risk pregnancy, unspecified, unspecified trimester: Secondary | ICD-10-CM

## 2024-06-05 DIAGNOSIS — Z3A32 32 weeks gestation of pregnancy: Secondary | ICD-10-CM

## 2024-06-05 DIAGNOSIS — O99213 Obesity complicating pregnancy, third trimester: Secondary | ICD-10-CM | POA: Diagnosis not present

## 2024-06-05 DIAGNOSIS — O99210 Obesity complicating pregnancy, unspecified trimester: Secondary | ICD-10-CM

## 2024-06-05 DIAGNOSIS — Z8759 Personal history of other complications of pregnancy, childbirth and the puerperium: Secondary | ICD-10-CM

## 2024-06-05 DIAGNOSIS — E669 Obesity, unspecified: Secondary | ICD-10-CM

## 2024-06-05 DIAGNOSIS — O09293 Supervision of pregnancy with other poor reproductive or obstetric history, third trimester: Secondary | ICD-10-CM

## 2024-06-05 NOTE — Progress Notes (Signed)
 After review, MFM consult with provider is not indicated for today  Arna Ranks, MD 06/05/2024 1:54 PM  [facility]

## 2024-06-06 ENCOUNTER — Telehealth: Payer: Self-pay | Admitting: *Deleted

## 2024-06-06 NOTE — Telephone Encounter (Signed)
 Left message to se if I can move pt's appointment on 8/5 to the AM instead of that afternoon.

## 2024-06-11 ENCOUNTER — Encounter: Payer: Self-pay | Admitting: Obstetrics & Gynecology

## 2024-06-11 ENCOUNTER — Ambulatory Visit (INDEPENDENT_AMBULATORY_CARE_PROVIDER_SITE_OTHER): Admitting: Obstetrics & Gynecology

## 2024-06-11 VITALS — BP 135/85 | HR 118 | Wt 320.0 lb

## 2024-06-11 DIAGNOSIS — O99210 Obesity complicating pregnancy, unspecified trimester: Secondary | ICD-10-CM | POA: Diagnosis not present

## 2024-06-11 DIAGNOSIS — O0990 Supervision of high risk pregnancy, unspecified, unspecified trimester: Secondary | ICD-10-CM

## 2024-06-11 DIAGNOSIS — Z8759 Personal history of other complications of pregnancy, childbirth and the puerperium: Secondary | ICD-10-CM

## 2024-06-11 DIAGNOSIS — Z3A33 33 weeks gestation of pregnancy: Secondary | ICD-10-CM

## 2024-06-11 NOTE — Progress Notes (Signed)
 PRENATAL VISIT NOTE  Subjective:  Pamela Munoz is a 28 y.o. G3P1011 at [redacted]w[redacted]d being seen today for ongoing prenatal care.  She is currently monitored for the following issues for this high-risk pregnancy and has Maternal morbid obesity, antepartum (HCC); History of gestational hypertension; Supervision of high risk pregnancy, antepartum; BMI 45.0-49.9, adult (HCC); and History of torn meniscus of left knee on their problem list.  Patient reports no complaints.  Contractions: Not present. Vag. Bleeding: None.  Movement: Present. Denies leaking of fluid.   The following portions of the patient's history were reviewed and updated as appropriate: allergies, current medications, past family history, past medical history, past social history, past surgical history and problem list.   Objective:    Vitals:   06/11/24 1336  BP: 135/85  Pulse: (!) 118  Weight: (!) 320 lb (145.2 kg)    Fetal Status:  Fetal Heart Rate (bpm): 151   Movement: Present    General: Alert, oriented and cooperative. Patient is in no acute distress.  Skin: Skin is warm and dry. No rash noted.   Cardiovascular: Normal heart rate noted  Respiratory: Normal respiratory effort, no problems with respiration noted  Abdomen: Soft, gravid, appropriate for gestational age.  Pain/Pressure: Present     Pelvic: Cervical exam deferred        Extremities: Normal range of motion.  Edema: Trace  Mental Status: Normal mood and affect. Normal behavior. Normal judgment and thought content.   US  MFM FETAL BPP WO NON STRESS Result Date: 06/05/2024 ----------------------------------------------------------------------  OBSTETRICS REPORT                       (Signed Final 06/05/2024 01:54 pm) ---------------------------------------------------------------------- Patient Info  ID #:       [id number]                          D.O.B.:  [birth year] (27 yrs)(F)  Name:       Pamela Munoz                 Visit Date: 06/05/2024 11:41 am  ---------------------------------------------------------------------- Performed By  Attending:        Fredia Fresh MD        Ref. Address:     [address]                                                             [address]  Performed By:     Emelia Coombs BS,       [facility]                    RDMS, RVT                                [facility]  Referred By:      [facility] Correne Beagle ---------------------------------------------------------------------- Orders  #  Description                           Code        Ordered By  1  US  MFM FETAL BPP WO NON               76819.01    RAVI SHANKAR     STRESS  2  US  MFM OB FOLLOW UP                   [id number]    RAVI [facility] ----------------------------------------------------------------------  #  Order #                     Accession #                Episode #  1  [id number]                   [id number]                 [id number]  2  [id number]                   [id number]                 [id number] ---------------------------------------------------------------------- Indications  [redacted] weeks gestation of pregnancy                Z3A.32  Obesity complicating pregnancy, third          O99.213  trimester (BMI 48)  Poor obstetric history: Previous               O09.299  preeclampsia / eclampsia/gestational HTN  LR NIPS/ Neg Horizon (2023)  Passed GTT 04-16-24  Encounter for other antenatal screening        Z36.2  follow-up ---------------------------------------------------------------------- Fetal Evaluation  Num Of Fetuses:         1  Fetal Heart Rate(bpm):  161  Cardiac Activity:       Observed  Presentation:           Cephalic  Placenta:               Posterior Left lateral  P. Cord Insertion:      Previously seen  Amniotic Fluid  AFI FV:      Subjectively upper-normal  AFI Sum(cm)     %Tile       Largest  Pocket(cm)  8.65            7           4.22  RUQ(cm)       RLQ(cm)       LUQ(cm)        LLQ(cm)  1.73          1.95          0              4.22 ---------------------------------------------------------------------- Biophysical Evaluation  Amniotic F.V:   Within  normal limits       F. Tone:        Observed  F. Movement:    Observed                   Score:          8/8  F. Breathing:   Observed ---------------------------------------------------------------------- Biometry  BPD:      89.1  mm     G. Age:  36w 0d         99  %    CI:        79.66   %    70 - 86                                                          FL/HC:      19.9   %    19.9 - 21.5  HC:      315.5  mm     G. Age:  35w 3d         79  %    HC/AC:      0.99        0.96 - 1.11  AC:      317.1  mm     G. Age:  35w 4d         98  %    FL/BPD:     70.4   %    71 - 87  FL:       62.7  mm     G. Age:  32w 3d         27  %    FL/AC:      19.8   %    20 - 24  Est. FW:    2523  gm      5 lb 9 oz     93  % ---------------------------------------------------------------------- OB History  Blood Type:   O+  Gravidity:    3         Term:   1        Prem:   0        SAB:   1  TOP:          0       Ectopic:  0        Living: 1 ---------------------------------------------------------------------- Gestational Age  LMP:           34w 3d        Date:  10/08/23                 EDD:   07/14/24  U/S Today:     34w 6d                                        EDD:   07/11/24  Best:          32w 6d     Det. By:  Early Ultrasound         EDD:   07/25/24                                      (  12/05/23) ---------------------------------------------------------------------- Anatomy  Cranium:               Appears normal         Aortic Arch:            Previously seen  Cavum:                 Appears normal         Ductal Arch:            Previously seen  Ventricles:            Previously seen        Diaphragm:              Appears normal  Choroid Plexus:        Previously seen         Stomach:                Appears normal, left                                                                        sided  Cerebellum:            Previously seen        Abdomen:                Previously seen  Posterior Fossa:       Previously seen        Abdominal Wall:         Previously seen  Face:                  Orbits and profile     Cord Vessels:           Previously seen                         previously seen  Lips:                  Previously seen        Kidneys:                Appear normal  Thoracic:              Appears normal         Bladder:                Appears normal  Heart:                 Previously seen        Spine:                  Previously seen  RVOT:                  Previously seen        Upper Extremities:      Previously seen  LVOT:                  Previously seen        Lower Extremities:      Previously seen ---------------------------------------------------------------------- Cervix Uterus Adnexa  Cervix  Normal appearance by transabdominal scan  Uterus  No abnormality visualized.  Right Ovary  Within normal limits.  Left Ovary  Within normal limits.  Cul De Sac  No free fluid seen.  Adnexa  No abnormality visualized ---------------------------------------------------------------------- Impression  Pregravid BMI 48.  Patient does not have gestational  diabetes.  Blood pressure today at our office is 125/56 mmHg  Amniotic fluid is normal good fetal activity seen.  The  estimated fetal weight is at the 93rd percentile and the  abdominal circumference measurement at the 98th  percentile.  Cephalic presentation.  Antenatal testing is  reassuring.  BPP 8/8. ---------------------------------------------------------------------- Recommendations  Continue weekly antenatal testing till delivery . ----------------------------------------------------------------------                 Fredia Fresh, MD Electronically Signed Final Report   06/05/2024 01:54 pm  ----------------------------------------------------------------------   US  MFM OB FOLLOW UP Result Date: 06/05/2024 ----------------------------------------------------------------------  OBSTETRICS REPORT                       (Signed Final 06/05/2024 01:54 pm) ---------------------------------------------------------------------- Patient Info  ID #:       [id number]                          D.O.B.:  [birth year] (27 yrs)(F)  Name:       Pamela Munoz                 Visit Date: 06/05/2024 11:41 am ---------------------------------------------------------------------- Performed By  Attending:        Fredia Fresh MD        Ref. Address:     [address]                                                             [address]  Performed By:     Emelia Coombs BS,       Location:         [facility]                    RDMS, RVT                                [facility] at                                                             [facility]  Referred By:      [facility] ---------------------------------------------------------------------- Orders  #  Description                           Code  Ordered By  1  US  MFM FETAL BPP WO NON               76819.01    RAVI SHANKAR     STRESS  2  US  MFM OB FOLLOW UP                   [id number]    RAVI [facility] ----------------------------------------------------------------------  #  Order #                     Accession #                Episode #  1  [id number]                   [id number]                 [id number]  2  [id number]                   [id number]                 [id number] ---------------------------------------------------------------------- Indications  [redacted] weeks gestation of pregnancy                Z3A.32  Obesity complicating pregnancy, third          O99.213  trimester (BMI 48)  Poor obstetric history: Previous               O09.299  preeclampsia / eclampsia/gestational HTN  LR NIPS/  Neg Horizon (2023)  Passed GTT 04-16-24  Encounter for other antenatal screening        Z36.2  follow-up ---------------------------------------------------------------------- Fetal Evaluation  Num Of Fetuses:         1  Fetal Heart Rate(bpm):  161  Cardiac Activity:       Observed  Presentation:           Cephalic  Placenta:               Posterior Left lateral  P. Cord Insertion:      Previously seen  Amniotic Fluid  AFI FV:      Subjectively upper-normal  AFI Sum(cm)     %Tile       Largest Pocket(cm)  8.65            7           4.22  RUQ(cm)       RLQ(cm)       LUQ(cm)        LLQ(cm)  1.73          1.95          0              4.22 ---------------------------------------------------------------------- Biophysical Evaluation  Amniotic F.V:   Within normal limits       F. Tone:        Observed  F. Movement:    Observed                   Score:          8/8  F. Breathing:   Observed ---------------------------------------------------------------------- Biometry  BPD:      89.1  mm     G. Age:  36w 0d         99  %    CI:        79.66   %    70 - 86  FL/HC:      19.9   %    19.9 - 21.5  HC:      315.5  mm     G. Age:  35w 3d         79  %    HC/AC:      0.99        0.96 - 1.11  AC:      317.1  mm     G. Age:  35w 4d         98  %    FL/BPD:     70.4   %    71 - 87  FL:       62.7  mm     G. Age:  32w 3d         27  %    FL/AC:      19.8   %    20 - 24  Est. FW:    2523  gm      5 lb 9 oz     93  % ---------------------------------------------------------------------- OB History  Blood Type:   O+  Gravidity:    3         Term:   1        Prem:   0        SAB:   1  TOP:          0       Ectopic:  0        Living: 1 ---------------------------------------------------------------------- Gestational Age  LMP:           34w 3d        Date:  10/08/23                 EDD:   07/14/24  U/S Today:     34w 6d                                        EDD:   07/11/24  Best:           32w 6d     Det. By:  Early Ultrasound         EDD:   07/25/24                                      (12/05/23) ---------------------------------------------------------------------- Anatomy  Cranium:               Appears normal         Aortic Arch:            Previously seen  Cavum:                 Appears normal         Ductal Arch:            Previously seen  Ventricles:            Previously seen        Diaphragm:              Appears normal  Choroid Plexus:        Previously seen        Stomach:                Appears  normal, left                                                                        sided  Cerebellum:            Previously seen        Abdomen:                Previously seen  Posterior Fossa:       Previously seen        Abdominal Wall:         Previously seen  Face:                  Orbits and profile     Cord Vessels:           Previously seen                         previously seen  Lips:                  Previously seen        Kidneys:                Appear normal  Thoracic:              Appears normal         Bladder:                Appears normal  Heart:                 Previously seen        Spine:                  Previously seen  RVOT:                  Previously seen        Upper Extremities:      Previously seen  LVOT:                  Previously seen        Lower Extremities:      Previously seen ---------------------------------------------------------------------- Cervix Uterus Adnexa  Cervix  Normal appearance by transabdominal scan  Uterus  No abnormality visualized.  Right Ovary  Within normal limits.  Left Ovary  Within normal limits.  Cul De Sac  No free fluid seen.  Adnexa  No abnormality visualized ---------------------------------------------------------------------- Impression  Pregravid BMI 48.  Patient does not have gestational  diabetes.  Blood pressure today at our office is 125/56 mmHg  Amniotic fluid is normal good fetal activity seen.  The  estimated fetal weight  is at the 93rd percentile and the  abdominal circumference measurement at the 98th  percentile.  Cephalic presentation.  Antenatal testing is  reassuring.  BPP 8/8. ---------------------------------------------------------------------- Recommendations  Continue weekly antenatal testing till delivery . ----------------------------------------------------------------------                 Fredia Fresh, MD Electronically Signed Final Report   06/05/2024 01:54 pm ----------------------------------------------------------------------    Assessment and Plan:  Pregnancy: G3P1011 at [redacted]w[redacted]d 1. Maternal morbid obesity, antepartum (HCC) (Primary) Already scheduled for antenatal testing weekly, starting  next week and growth scans as per MFM  2. History of gestational hypertension Borderline BPs, no symptoms. Precautions reviewed, will continue to monitor closely.  3. [redacted] weeks gestation of pregnancy 4. Supervision of high risk pregnancy, antepartum No other concerns. Preterm labor symptoms and general obstetric precautions including but not limited to vaginal bleeding, contractions, leaking of fluid and fetal movement were reviewed in detail with the patient. Please refer to After Visit Summary for other counseling recommendations.   Return in about 1 week (around 06/18/2024) for OB visits and antenatal testing as scheduled.  Future Appointments  Date Time Provider Department Center  06/18/2024  9:15 AM [facility] NST [facility]  06/25/2024  2:10 PM [facility]  06/25/2024  2:30 PM Izell Harari, MD [facility]  07/02/2024  8:35 AM Herchel Gloris LABOR, MD [facility]  07/03/2024 11:00 AM [facility] PROVIDER 1 [facility]  07/03/2024 11:30 AM [facility] US2 [facility]  07/09/2024  9:15 AM [facility] NST [facility]  07/09/2024  9:35 AM Fredirick Glenys RAMAN, MD [facility]  10/30/2024 11:00 AM Wellington Curtis LABOR, FNP [facility]    Gloris Herchel,  MD

## 2024-06-11 NOTE — Patient Instructions (Signed)

## 2024-06-17 ENCOUNTER — Ambulatory Visit (INDEPENDENT_AMBULATORY_CARE_PROVIDER_SITE_OTHER): Payer: Self-pay

## 2024-06-17 ENCOUNTER — Encounter: Payer: Self-pay | Admitting: Obstetrics & Gynecology

## 2024-06-17 ENCOUNTER — Ambulatory Visit (INDEPENDENT_AMBULATORY_CARE_PROVIDER_SITE_OTHER): Admitting: Obstetrics & Gynecology

## 2024-06-17 VITALS — BP 130/83 | HR 99 | Wt 322.0 lb

## 2024-06-17 DIAGNOSIS — Z0371 Encounter for suspected problem with amniotic cavity and membrane ruled out: Secondary | ICD-10-CM

## 2024-06-17 DIAGNOSIS — S83512A Sprain of anterior cruciate ligament of left knee, initial encounter: Secondary | ICD-10-CM | POA: Diagnosis not present

## 2024-06-17 DIAGNOSIS — Z8759 Personal history of other complications of pregnancy, childbirth and the puerperium: Secondary | ICD-10-CM

## 2024-06-17 DIAGNOSIS — Z3A34 34 weeks gestation of pregnancy: Secondary | ICD-10-CM

## 2024-06-17 DIAGNOSIS — O99213 Obesity complicating pregnancy, third trimester: Secondary | ICD-10-CM | POA: Diagnosis not present

## 2024-06-17 DIAGNOSIS — O0990 Supervision of high risk pregnancy, unspecified, unspecified trimester: Secondary | ICD-10-CM

## 2024-06-17 DIAGNOSIS — O99210 Obesity complicating pregnancy, unspecified trimester: Secondary | ICD-10-CM

## 2024-06-17 NOTE — Progress Notes (Signed)

## 2024-06-17 NOTE — Progress Notes (Signed)
   PRENATAL VISIT NOTE  Subjective:  Pamela Munoz is a 28 y.o. G3P1011 at [redacted]w[redacted]d being seen today for evaluation of possible rupture of membranes and ongoing prenatal care.  She is currently monitored for the following issues for this high-risk pregnancy and has Maternal morbid obesity, antepartum (HCC); History of gestational hypertension; Supervision of high risk pregnancy, antepartum; BMI 45.0-49.9, adult (HCC); and History of torn meniscus of left knee on their problem list.  Patient reports some leaking of fluid, possible increased discharge.  Contractions: Irritability. Vag. Bleeding: None.  Movement: Present. Denies leaking of fluid.   The following portions of the patient's history were reviewed and updated as appropriate: allergies, current medications, past family history, past medical history, past social history, past surgical history and problem list.   Objective:    Vitals:   06/17/24 1522  BP: 130/83  Pulse: 99  Weight: (!) 322 lb (146.1 kg)    Fetal Status:      Movement: Present    General: Alert, oriented and cooperative. Patient is in no acute distress.  Skin: Skin is warm and dry. No rash noted.   Cardiovascular: Normal heart rate noted  Respiratory: Normal respiratory effort, no problems with respiration noted  Abdomen: Soft, gravid, appropriate for gestational age.  Pain/Pressure: Present     Pelvic: Exam performed in the presence of a chaperone. No pooling, negative ferning.  Initial Amniotest (pH indicator) was very faintly green (blue could be indicative of ROM).  Repeat Amniotest was normal yellow. Closed cervix, no fluid loss noted even with Valsalva        Extremities: Normal range of motion.  Edema: Trace  Mental Status: Normal mood and affect. Normal behavior. Normal judgment and thought content.   Bedside ultrasound:  Normal AFV noted, BPP 8/8  Assessment and Plan:  Pregnancy: G3P1011 at [redacted]w[redacted]d 1. Encounter for suspected PROM, with rupture of  membranes not found No definitive evidence of ROM noted, patient was told to go to [facility] MAU if leaking continues for more definitive testing.   Reassured by negative testing today and normal AFV on ultrasound.  2. History of gestational hypertension Stable BP, no medications  3. Maternal morbid obesity, antepartum (HCC) (Primary) BPP 8/8 today. - US  FETAL BPP W/NONSTRESS; Future  4. [redacted] weeks gestation of pregnancy 5. Supervision of high risk pregnancy, antepartum No other concerns.  Preterm labor symptoms and general obstetric precautions including but not limited to vaginal bleeding, contractions, leaking of fluid and fetal movement were reviewed in detail with the patient. Please refer to After Visit Summary for other counseling recommendations.   Return in about 8 days (around 06/25/2024) for OB visits and antenatal testing as scheduled.  Future Appointments  Date Time Provider Department Center  06/18/2024  9:15 AM [facility]  06/25/2024  2:10 PM [facility]  06/25/2024  2:30 PM Izell Harari, MD [facility]  07/02/2024  8:35 AM Herchel Gloris LABOR, MD [facility]  07/03/2024 11:00 AM [facility] PROVIDER 1 [facility]  07/03/2024 11:30 AM [facility] US2 [facility]  07/09/2024  9:15 AM [facility] NST [facility]  07/09/2024  9:35 AM Fredirick Glenys RAMAN, MD [facility]  10/30/2024 11:00 AM Wellington Curtis LABOR, FNP [facility]    Gloris Herchel, MD

## 2024-06-18 ENCOUNTER — Other Ambulatory Visit

## 2024-06-23 ENCOUNTER — Other Ambulatory Visit: Payer: Self-pay

## 2024-06-23 ENCOUNTER — Inpatient Hospital Stay (HOSPITAL_COMMUNITY)
Admission: AD | Admit: 2024-06-23 | Discharge: 2024-06-23 | Disposition: A | Discharge status: Home / Self Care | Attending: Obstetrics & Gynecology | Admitting: Obstetrics & Gynecology

## 2024-06-23 ENCOUNTER — Encounter (HOSPITAL_COMMUNITY): Payer: Self-pay | Admitting: Obstetrics & Gynecology

## 2024-06-23 DIAGNOSIS — Z3A35 35 weeks gestation of pregnancy: Secondary | ICD-10-CM | POA: Diagnosis not present

## 2024-06-23 DIAGNOSIS — Z0371 Encounter for suspected problem with amniotic cavity and membrane ruled out: Secondary | ICD-10-CM

## 2024-06-23 LAB — RUPTURE OF MEMBRANE (ROM)PLUS: Rom Plus: NEGATIVE

## 2024-06-23 NOTE — MAU Note (Signed)
 Pamela Munoz is a 28 y.o. at [redacted]w[redacted]d here in MAU reporting: she's been leaking fluid since this morning, states has felt leaking throughout the day.  Report fluid is yellow/clear and has filled a sanitary napkin.  Denies VB.  Endorses +FM.  Also reports intermittent pelvic pressure.  States was evaluated for leaking in office on Tuesday.  Ultrasound dine, normal fluid level noted.  LMP: 10/08/2023 Onset of complaint: today Pain score: 5 Vitals:   06/23/24 1550  BP: 134/69  Pulse: (!) 101  Resp: 19  Temp: (!) 97.4 F (36.3 C)  SpO2: 99%     FHT: deferred secondary maternal apparel, wearing a dress  Lab orders placed from triage: None

## 2024-06-23 NOTE — MAU Provider Note (Signed)
 S: Ms. Pamela Munoz is a 28 y.o. G3P1011 at [redacted]w[redacted]d  who presents to MAU today complaining of leaking of fluid since this morning. She denies vaginal bleeding. She denies contractions. She reports normal fetal movement.    O: BP 129/75 (BP Location: Right Arm)   Pulse (!) 105   Temp (!) 97.4 F (36.3 C) (Oral)   Resp 19   Ht 5' 6 (1.676 m)   Wt (!) 143.7 kg   LMP 10/08/2023   SpO2 99%   BMI 51.12 kg/m  GENERAL: Well-developed, well-nourished female in no acute distress.  HEAD: Normocephalic, atraumatic.  CHEST: Normal effort of breathing, regular heart rate ABDOMEN: Soft, nontender, gravid PELVIC: Normal external female genitalia. Vagina is pink and rugated. Cervix with normal contour, no lesions. Normal discharge.  Negative pooling.   Cervical exam:  Visually closed by SSE   Fetal Monitoring: Baseline: 145 Variability: moderate Accelerations: present Decelerations: absent Contractions: none  Results for orders placed or performed during the hospital encounter of 06/23/24 (from the past 24 hours)  Rupture of Membrane (ROM) Plus     Status: None   Collection Time: 06/23/24  4:51 PM  Result Value Ref Range   Rom Plus NEGATIVE     A: SIUP at [redacted]w[redacted]d  Membranes intact  P: 1. No leakage of amniotic fluid into vagina (Primary) - Reassurance given that her water has not broken today  2. [redacted] weeks gestation of pregnancy   - Discharge home  - Keep scheduled appt with [facility] on 06/25/2024 - Patient verbalized an understanding of the plan of care and agrees.   Payden Docter, CNM 06/23/2024, 5:37 PM

## 2024-06-25 ENCOUNTER — Ambulatory Visit: Admitting: *Deleted

## 2024-06-25 ENCOUNTER — Ambulatory Visit (INDEPENDENT_AMBULATORY_CARE_PROVIDER_SITE_OTHER): Admitting: Obstetrics and Gynecology

## 2024-06-25 ENCOUNTER — Other Ambulatory Visit (INDEPENDENT_AMBULATORY_CARE_PROVIDER_SITE_OTHER): Payer: Self-pay

## 2024-06-25 VITALS — BP 119/80 | HR 90 | Wt 325.0 lb

## 2024-06-25 DIAGNOSIS — O3663X0 Maternal care for excessive fetal growth, third trimester, not applicable or unspecified: Secondary | ICD-10-CM

## 2024-06-25 DIAGNOSIS — O99213 Obesity complicating pregnancy, third trimester: Secondary | ICD-10-CM | POA: Diagnosis not present

## 2024-06-25 DIAGNOSIS — Z3A35 35 weeks gestation of pregnancy: Secondary | ICD-10-CM

## 2024-06-25 DIAGNOSIS — O99210 Obesity complicating pregnancy, unspecified trimester: Secondary | ICD-10-CM | POA: Diagnosis not present

## 2024-06-25 DIAGNOSIS — O0990 Supervision of high risk pregnancy, unspecified, unspecified trimester: Secondary | ICD-10-CM

## 2024-06-25 DIAGNOSIS — Z8759 Personal history of other complications of pregnancy, childbirth and the puerperium: Secondary | ICD-10-CM | POA: Diagnosis not present

## 2024-06-25 DIAGNOSIS — Z6841 Body Mass Index (BMI) 40.0 and over, adult: Secondary | ICD-10-CM | POA: Diagnosis not present

## 2024-06-25 NOTE — Progress Notes (Signed)

## 2024-06-26 ENCOUNTER — Other Ambulatory Visit

## 2024-06-26 NOTE — Progress Notes (Signed)
   PRENATAL VISIT NOTE  Subjective:  Pamela Munoz is a 28 y.o. G3P1011 at [redacted]w[redacted]d being seen today for ongoing prenatal care.  She is currently monitored for the following issues for this high-risk pregnancy and has Maternal morbid obesity, antepartum (HCC); History of gestational hypertension; Supervision of high risk pregnancy, antepartum; BMI 45.0-49.9, adult (HCC); and History of torn meniscus of left knee on their problem list.  Patient reports no complaints.  Contractions: Not present. Vag. Bleeding: None.  Movement: Present. Denies leaking of fluid.   The following portions of the patient's history were reviewed and updated as appropriate: allergies, current medications, past family history, past medical history, past social history, past surgical history and problem list.   Objective:    Vitals:   06/25/24 1456 06/25/24 1517  BP: 139/84 119/80  Pulse: 97 90  Weight: (!) 325 lb (147.4 kg)     Fetal Status:  Fetal Heart Rate (bpm): NST   Movement: Present    General: Alert, oriented and cooperative. Patient is in no acute distress.  Skin: Skin is warm and dry. No rash noted.   Cardiovascular: Normal heart rate noted  Respiratory: Normal respiratory effort, no problems with respiration noted  Abdomen: Soft, gravid, appropriate for gestational age.  Pain/Pressure: Present     Pelvic: Cervical exam deferred        Extremities: Normal range of motion.     Mental Status: Normal mood and affect. Normal behavior. Normal judgment and thought content.   Assessment and Plan:  Pregnancy: G3P1011 at [redacted]w[redacted]d 1. [redacted] weeks gestation of pregnancy (Primary) GBS next visit  2. BMI 45.0-49.9, adult [facility]) D/w her re: 39wk IOL and can try and set up next visit BPP cephalic and 10/10 today  3. Maternal morbid obesity, antepartum (HCC)   4. History of gestational hypertension Continue low dose ASA. Recommend pt take BPs qday at home and pre-eclampsia precautions given.   5. LGA 7/23: efw  93%, 2523g, ac 98%, cephalic  Preterm labor symptoms and general obstetric precautions including but not limited to vaginal bleeding, contractions, leaking of fluid and fetal movement were reviewed in detail with the patient. Please refer to After Visit Summary for other counseling recommendations.   No follow-ups on file.  Future Appointments  Date Time Provider Department Center  07/02/2024  8:35 AM Herchel Gloris LABOR, MD [facility]  07/03/2024 11:00 AM [facility] PROVIDER 1 [facility]  07/03/2024 11:30 AM [facility] US2 [facility]  07/09/2024  9:15 AM [facility] NST [facility]  07/09/2024  9:35 AM Fredirick Glenys RAMAN, MD [facility]  10/30/2024 11:00 AM Wellington Curtis LABOR, FNP [facility]    Bebe Furry, MD

## 2024-07-01 ENCOUNTER — Ambulatory Visit (INDEPENDENT_AMBULATORY_CARE_PROVIDER_SITE_OTHER): Admitting: *Deleted

## 2024-07-01 VITALS — BP 132/85 | Wt 326.0 lb

## 2024-07-01 DIAGNOSIS — Z0131 Encounter for examination of blood pressure with abnormal findings: Secondary | ICD-10-CM

## 2024-07-01 DIAGNOSIS — Z8759 Personal history of other complications of pregnancy, childbirth and the puerperium: Secondary | ICD-10-CM | POA: Diagnosis not present

## 2024-07-01 NOTE — Progress Notes (Signed)
 Subjective:  Pamela Munoz is a 28 y.o. female here for BP check. On Friday she had 4 elevated ones, 160/107, 148/104, 148/97 and then 153/94 with some increased swelling.Had pt come in today for us  to check her BP and get lab work.  Hypertension ROS: Patient denies any headaches, visual symptoms, RUQ/epigastric pain, does have +2 swelling in lower legs and feet.  Objective:  BP 132/85   Wt (!) 326 lb (147.9 kg)   LMP 10/08/2023   BMI 52.62 kg/m   Appearance alert, well appearing, and in no distress. General exam BP noted to be stable today in office.    Assessment:   Blood Pressure stable.   Plan:  Will get blood work today and then pt has her ROB tomorrow and she will bring her BP cuff from home as well.  Wanda Buckles, RN .

## 2024-07-02 ENCOUNTER — Ambulatory Visit (INDEPENDENT_AMBULATORY_CARE_PROVIDER_SITE_OTHER): Admitting: Obstetrics & Gynecology

## 2024-07-02 ENCOUNTER — Other Ambulatory Visit (HOSPITAL_COMMUNITY)
Admission: RE | Admit: 2024-07-02 | Discharge: 2024-07-02 | Disposition: A | Discharge status: Home / Self Care | Source: Ambulatory Visit | Attending: Obstetrics & Gynecology | Admitting: Obstetrics & Gynecology

## 2024-07-02 VITALS — BP 134/86 | HR 99 | Wt 325.0 lb

## 2024-07-02 DIAGNOSIS — O0990 Supervision of high risk pregnancy, unspecified, unspecified trimester: Secondary | ICD-10-CM

## 2024-07-02 DIAGNOSIS — Z113 Encounter for screening for infections with a predominantly sexual mode of transmission: Secondary | ICD-10-CM | POA: Insufficient documentation

## 2024-07-02 DIAGNOSIS — Z8759 Personal history of other complications of pregnancy, childbirth and the puerperium: Secondary | ICD-10-CM

## 2024-07-02 DIAGNOSIS — O3663X0 Maternal care for excessive fetal growth, third trimester, not applicable or unspecified: Secondary | ICD-10-CM | POA: Diagnosis not present

## 2024-07-02 DIAGNOSIS — O99210 Obesity complicating pregnancy, unspecified trimester: Secondary | ICD-10-CM

## 2024-07-02 DIAGNOSIS — Z3A36 36 weeks gestation of pregnancy: Secondary | ICD-10-CM | POA: Insufficient documentation

## 2024-07-02 LAB — CBC
Hematocrit: 41.6 % (ref 34.0–46.6)
Hemoglobin: 13.2 g/dL (ref 11.1–15.9)
MCH: 26.9 pg (ref 26.6–33.0)
MCHC: 31.7 g/dL (ref 31.5–35.7)
MCV: 85 fL (ref 79–97)
Platelets: 178 x10E3/uL (ref 150–450)
RBC: 4.9 x10E6/uL (ref 3.77–5.28)
RDW: 14.4 % (ref 11.7–15.4)
WBC: 6.6 x10E3/uL (ref 3.4–10.8)

## 2024-07-02 LAB — COMPREHENSIVE METABOLIC PANEL WITH GFR
ALT: 13 IU/L (ref 0–32)
AST: 18 IU/L (ref 0–40)
Albumin: 3.6 g/dL — ABNORMAL LOW (ref 4.0–5.0)
Alkaline Phosphatase: 154 IU/L — ABNORMAL HIGH (ref 44–121)
BUN/Creatinine Ratio: 13 (ref 9–23)
BUN: 7 mg/dL (ref 6–20)
Bilirubin Total: 0.4 mg/dL (ref 0.0–1.2)
CO2: 18 mmol/L — ABNORMAL LOW (ref 20–29)
Calcium: 8.9 mg/dL (ref 8.7–10.2)
Chloride: 105 mmol/L (ref 96–106)
Creatinine, Ser: 0.55 mg/dL — ABNORMAL LOW (ref 0.57–1.00)
Globulin, Total: 2.2 g/dL (ref 1.5–4.5)
Glucose: 85 mg/dL (ref 70–99)
Potassium: 4.5 mmol/L (ref 3.5–5.2)
Sodium: 135 mmol/L (ref 134–144)
Total Protein: 5.8 g/dL — ABNORMAL LOW (ref 6.0–8.5)
eGFR: 130 mL/min/1.73 (ref >59)

## 2024-07-02 LAB — PROTEIN / CREATININE RATIO, URINE
Creatinine, Urine: 30.1 mg/dL
Protein, Ur: 7.4 mg/dL
Protein/Creat Ratio: 246 mg/g{creat} — ABNORMAL HIGH (ref 0–200)

## 2024-07-02 NOTE — Progress Notes (Signed)
 PRENATAL VISIT NOTE  Subjective:  Pamela Munoz is a 28 y.o. G3P1011 at [redacted]w[redacted]d being seen today for ongoing prenatal care.  She is currently monitored for the following issues for this high-risk pregnancy and has Maternal morbid obesity, antepartum (HCC); History of gestational hypertension; Supervision of high risk pregnancy, antepartum; BMI 45.0-49.9, adult (HCC); History of torn meniscus of left knee; and Large for gestational age fetus affecting mother, third trimester on their problem list.  Patient reports no complaints.  Patient denies any headaches, visual symptoms, RUQ/epigastric pain or other concerning symptoms.   Contractions: Irritability. Vag. Bleeding: None.  Movement: Present. Denies leaking of fluid but worried about watery discharge noted.   The following portions of the patient's history were reviewed and updated as appropriate: allergies, current medications, past family history, past medical history, past social history, past surgical history and problem list.   Objective:    Vitals:   07/02/24 0842 07/02/24 0908  BP: (!) 152/78 134/86  Pulse: 99   Weight: (!) 325 lb (147.4 kg)     Fetal Status:  Fetal Heart Rate (bpm): 132   Movement: Present Presentation: Vertex  General: Alert, oriented and cooperative. Patient is in no acute distress.  Skin: Skin is warm and dry. No rash noted.   Cardiovascular: Normal heart rate noted  Respiratory: Normal respiratory effort, no problems with respiration noted  Abdomen: Soft, gravid, appropriate for gestational age.  Pain/Pressure: Present     Pelvic: Cervical exam performed in the presence of a chaperone Dilation: 3 Effacement (%): 50 Station: -3. Clear discharge seen, no pooling noted, Amniotest negative. Pelvic cultures done.  Extremities: Normal range of motion.  Edema: Moderate pitting, indentation subsides rapidly  Mental Status: Normal mood and affect. Normal behavior. Normal judgment and thought content.   US   FETAL BPP W/NONSTRESS Result Date: 06/28/2024 ----------------------------------------------------------------------  OBSTETRICS REPORT                        (Signed Final 06/28/2024 10:23 pm) ---------------------------------------------------------------------- Patient Info  ID #:       [id number]                          D.O.B.:  10-23-1996 (27 yrs)(F)  Name:       Pamela Munoz                 Visit Date: 06/25/2024 08:17 am ---------------------------------------------------------------------- Performed By  Attending:        Bebe Furry MD     Ref. Address:      [address]                                                              [address]  Performed By:     Wanda Buckles RN     [facility]  Referred By:      [facility] Correne Beagle ---------------------------------------------------------------------- Orders  #  Description                           Code        Ordered By  1  US  FETAL BPP W/NONSTRESS              23181.5     BEBE FURRY ----------------------------------------------------------------------  #  Order #                     Accession #                Episode #  1  [id number]                   [id number]                 [id number] ---------------------------------------------------------------------- Indications  [redacted] weeks gestation of pregnancy                 Z3A.35  Obesity complicating pregnancy                  O99.210 E66.9 ---------------------------------------------------------------------- Fetal Evaluation  Num Of Fetuses:          1  Cardiac Activity:        Observed  Presentation:            Cephalic  Amniotic Fluid  AFI FV:      Within normal limits  AFI Sum(cm)     %Tile       Largest Pocket(cm)  12.52           40          3.83  RUQ(cm)       RLQ(cm)        LUQ(cm)        LLQ(cm)  3.83          3.17          3.68           1.85 ---------------------------------------------------------------------- Biophysical Evaluation  Amniotic F.V:   Within normal limits       F. Tone:         Observed  F. Movement:    Observed                   N.S.T:           Reactive  F. Breathing:   Observed                   Score:           10/10 ---------------------------------------------------------------------- OB History  Blood Type:  O+  Gravidity:    3         Term:   1        Prem:   0        SAB:   1  TOP:          0       Ectopic:  0        Living: 1 ---------------------------------------------------------------------- Gestational Age  LMP:           37w 2d        Date:  10/08/23                 EDD:   07/14/24  Best:          35w 5d     Det. By:  Early Ultrasound         EDD:   07/25/24                                      (12/05/23) ---------------------------------------------------------------------- Impression  Reassuring antenatal testing ---------------------------------------------------------------------- Recommendations  Continue with weekly testing ----------------------------------------------------------------------                 Bebe Furry, MD Electronically Signed Final Report   06/28/2024 10:23 pm ----------------------------------------------------------------------   US  FETAL BPP W/NONSTRESS Result Date: 06/19/2024 ----------------------------------------------------------------------  OBSTETRICS REPORT                       (Signed Final 06/19/2024 01:08 pm) ---------------------------------------------------------------------- Patient Info  ID #:       [id number]                          D.O.B.:  May 25, 1996 (27 yrs)(F)  Name:       Pamela Munoz                 Visit Date: 06/17/2024 04:28 pm ---------------------------------------------------------------------- Performed By  Attending:        Gloris Hugger         Ref. Address:     [address]                     [address]                                                             [address]  Performed By:     Wanda Buckles RN     [facility]         [facility]                                                             [facility]  Referred By:      [facility] Correne Beagle ---------------------------------------------------------------------- Orders  #  Description                           Code        Ordered By  1  US  FETAL BPP W/NONSTRESS              23181.5     GLORIS HUGGER ----------------------------------------------------------------------  #  Order #                     Accession #                Episode #  1  [id number]                   [id number]                 [id number] ---------------------------------------------------------------------- Indications  [redacted] weeks gestation of pregnancy                Z3A.34  Obesity complicating pregnancy                 O99.210 E66.9 ---------------------------------------------------------------------- Fetal Evaluation  Num Of Fetuses:         1  Cardiac Activity:       Observed  Presentation:           Cephalic  Amniotic Fluid  AFI FV:      Within normal limits  AFI Sum(cm)     %Tile       Largest Pocket(cm)  14.5            52          6.21  RUQ(cm)       RLQ(cm)       LUQ(cm)        LLQ(cm)  5.09          0.98          6.21           2.22 ---------------------------------------------------------------------- Biophysical Evaluation  Amniotic F.V:   Within normal limits       F. Tone:        Observed  F. Movement:    Observed                   Score:          8/8  F. Breathing:   Observed ---------------------------------------------------------------------- OB History  Blood Type:   O+  Gravidity:    3         Term:   1        Prem:   0        SAB:   1  TOP:          0       Ectopic:  0        Living: 1  ---------------------------------------------------------------------- Gestational Age  LMP:           36w 1d        Date:  10/08/23  EDD:   07/14/24  Best:          34w 4d     Det. By:  Early Ultrasound         EDD:   07/25/24                                      (12/05/23) ---------------------------------------------------------------------- Impression  Antenatal testing is reassuring with BPP 8/8. ---------------------------------------------------------------------- Recommendations  Continue weekly antenatal testing till delivery. ----------------------------------------------------------------------              Gloris Hugger, MD Electronically Signed Final Report   06/19/2024 01:08 pm ----------------------------------------------------------------------   US  MFM FETAL BPP WO NON STRESS Result Date: 06/05/2024 ----------------------------------------------------------------------  OBSTETRICS REPORT                       (Signed Final 06/05/2024 01:54 pm) ---------------------------------------------------------------------- Patient Info  ID #:       [id number]                          D.O.B.:  [birth year] (27 yrs)(F)  Name:       Pamela Munoz                 Visit Date: 06/05/2024 11:41 am ---------------------------------------------------------------------- Performed By  Attending:        Fredia Fresh MD        Ref. Address:     [address]                                                             [address]  Performed By:     Emelia Coombs BS,       [facility]  Referred By:      [facility] ---------------------------------------------------------------------- Orders  #  Description                           Code        Ordered By  1  US  MFM FETAL BPP  WO  NON               23180.98    RAVI SHANKAR     STRESS  2  US  MFM OB FOLLOW UP                   [id number]    RAVI [facility] ----------------------------------------------------------------------  #  Order #                     Accession #                Episode #  1  [id number]                   [id number]                 [id number]  2  [id number]                   [id number]                 [id number] ---------------------------------------------------------------------- Indications  [redacted] weeks gestation of pregnancy                Z3A.32  Obesity complicating pregnancy, third          O99.213  trimester (BMI 48)  Poor obstetric history: Previous               O09.299  preeclampsia / eclampsia/gestational HTN  LR NIPS/ Neg Horizon (2023)  Passed GTT 04-16-24  Encounter for other antenatal screening        Z36.2  follow-up ---------------------------------------------------------------------- Fetal Evaluation  Num Of Fetuses:         1  Fetal Heart Rate(bpm):  161  Cardiac Activity:       Observed  Presentation:           Cephalic  Placenta:               Posterior Left lateral  P. Cord Insertion:      Previously seen  Amniotic Fluid  AFI FV:      Subjectively upper-normal  AFI Sum(cm)     %Tile       Largest Pocket(cm)  8.65            7           4.22  RUQ(cm)       RLQ(cm)       LUQ(cm)        LLQ(cm)  1.73          1.95          0              4.22 ---------------------------------------------------------------------- Biophysical Evaluation  Amniotic F.V:   Within normal limits       F. Tone:        Observed  F. Movement:    Observed                   Score:          8/8  F. Breathing:   Observed ---------------------------------------------------------------------- Biometry  BPD:      89.1  mm     G. Age:  36w 0d         99  %    CI:        79.66   %    70 - 86  FL/HC:      19.9   %    19.9 - 21.5  HC:      315.5  mm     G. Age:  35w 3d         79  %    HC/AC:       0.99        0.96 - 1.11  AC:      317.1  mm     G. Age:  35w 4d         98  %    FL/BPD:     70.4   %    71 - 87  FL:       62.7  mm     G. Age:  32w 3d         27  %    FL/AC:      19.8   %    20 - 24  Est. FW:    2523  gm      5 lb 9 oz     93  % ---------------------------------------------------------------------- OB History  Blood Type:   O+  Gravidity:    3         Term:   1        Prem:   0        SAB:   1  TOP:          0       Ectopic:  0        Living: 1 ---------------------------------------------------------------------- Gestational Age  LMP:           34w 3d        Date:  10/08/23                 EDD:   07/14/24  U/S Today:     34w 6d                                        EDD:   07/11/24  Best:          32w 6d     Det. By:  Early Ultrasound         EDD:   07/25/24                                      (12/05/23) ---------------------------------------------------------------------- Anatomy  Cranium:               Appears normal         Aortic Arch:            Previously seen  Cavum:                 Appears normal         Ductal Arch:            Previously seen  Ventricles:            Previously seen        Diaphragm:              Appears normal  Choroid Plexus:        Previously seen        Stomach:                Appears  normal, left                                                                        sided  Cerebellum:            Previously seen        Abdomen:                Previously seen  Posterior Fossa:       Previously seen        Abdominal Wall:         Previously seen  Face:                  Orbits and profile     Cord Vessels:           Previously seen                         previously seen  Lips:                  Previously seen        Kidneys:                Appear normal  Thoracic:              Appears normal         Bladder:                Appears normal  Heart:                 Previously seen        Spine:                  Previously seen  RVOT:                  Previously seen         Upper Extremities:      Previously seen  LVOT:                  Previously seen        Lower Extremities:      Previously seen ---------------------------------------------------------------------- Cervix Uterus Adnexa  Cervix  Normal appearance by transabdominal scan  Uterus  No abnormality visualized.  Right Ovary  Within normal limits.  Left Ovary  Within normal limits.  Cul De Sac  No free fluid seen.  Adnexa  No abnormality visualized ---------------------------------------------------------------------- Impression  Pregravid BMI 48.  Patient does not have gestational  diabetes.  Blood pressure today at our office is 125/56 mmHg  Amniotic fluid is normal good fetal activity seen.  The  estimated fetal weight is at the 93rd percentile and the  abdominal circumference measurement at the 98th  percentile.  Cephalic presentation.  Antenatal testing is  reassuring.  BPP 8/8. ---------------------------------------------------------------------- Recommendations  Continue weekly antenatal testing till delivery . ----------------------------------------------------------------------                 Fredia Fresh, MD Electronically Signed Final Report   06/05/2024 01:54 pm ----------------------------------------------------------------------   US  MFM OB FOLLOW UP Result Date: 06/05/2024 ----------------------------------------------------------------------  OBSTETRICS REPORT                       (  Signed Final 06/05/2024 01:54 pm) ---------------------------------------------------------------------- Patient Info  ID #:       [id number]                          D.O.B.:  04/30/1996 (27 yrs)(F)  Name:       Pamela Munoz                 Visit Date: 06/05/2024 11:41 am ---------------------------------------------------------------------- Performed By  Attending:        Fredia Fresh MD        Ref. Address:     [address]                                                             [address]  Performed By:     Emelia Coombs  BS,       [facility]T                                [facility]  Referred By:      [facility] ---------------------------------------------------------------------- Orders  #  Description                           Code        Ordered By  1  US  MFM FETAL BPP WO NON               76819.01    RAVI SHANKAR     STRESS  2  US  MFM OB FOLLOW UP                   23183.98    RAVI SHANKAR ----------------------------------------------------------------------  #  Order #                     Accession #                Episode #  1  [id number]                   [id number]                 [id number]  2  [id number]  [id number]                 [id number] ---------------------------------------------------------------------- Indications  [redacted] weeks gestation of pregnancy                Z3A.32  Obesity complicating pregnancy, third          O99.213  trimester (BMI 48)  Poor obstetric history: Previous               O09.299  preeclampsia / eclampsia/gestational HTN  LR NIPS/ Neg Horizon (2023)  Passed GTT 04-16-24  Encounter for other antenatal screening        Z36.2  follow-up ---------------------------------------------------------------------- Fetal Evaluation  Num Of Fetuses:         1  Fetal Heart Rate(bpm):  161  Cardiac Activity:       Observed  Presentation:           Cephalic  Placenta:               Posterior Left lateral  P. Cord Insertion:      Previously seen  Amniotic Fluid  AFI FV:      Subjectively upper-normal  AFI Sum(cm)     %Tile       Largest Pocket(cm)  8.65            7           4.22  RUQ(cm)       RLQ(cm)       LUQ(cm)        LLQ(cm)  1.73          1.95          0              4.22 ---------------------------------------------------------------------- Biophysical Evaluation  Amniotic F.V:   Within  normal limits       F. Tone:        Observed  F. Movement:    Observed                   Score:          8/8  F. Breathing:   Observed ---------------------------------------------------------------------- Biometry  BPD:      89.1  mm     G. Age:  36w 0d         99  %    CI:        79.66   %    70 - 86                                                          FL/HC:      19.9   %    19.9 - 21.5  HC:      315.5  mm     G. Age:  35w 3d         79  %    HC/AC:      0.99        0.96 - 1.11  AC:      317.1  mm     G. Age:  35w 4d         98  %    FL/BPD:     70.4   %    71 - 87  FL:  62.7  mm     G. Age:  32w 3d         27  %    FL/AC:      19.8   %    20 - 24  Est. FW:    2523  gm      5 lb 9 oz     93  % ---------------------------------------------------------------------- OB History  Blood Type:   O+  Gravidity:    3         Term:   1        Prem:   0        SAB:   1  TOP:          0       Ectopic:  0        Living: 1 ---------------------------------------------------------------------- Gestational Age  LMP:           34w 3d        Date:  10/08/23                 EDD:   07/14/24  U/S Today:     34w 6d                                        EDD:   07/11/24  Best:          32w 6d     Det. By:  Early Ultrasound         EDD:   07/25/24                                      (12/05/23) ---------------------------------------------------------------------- Anatomy  Cranium:               Appears normal         Aortic Arch:            Previously seen  Cavum:                 Appears normal         Ductal Arch:            Previously seen  Ventricles:            Previously seen        Diaphragm:              Appears normal  Choroid Plexus:        Previously seen        Stomach:                Appears normal, left                                                                        sided  Cerebellum:            Previously seen        Abdomen:                Previously seen  Posterior Fossa:  Previously seen         Abdominal Wall:         Previously seen  Face:                  Orbits and profile     Cord Vessels:           Previously seen                         previously seen  Lips:                  Previously seen        Kidneys:                Appear normal  Thoracic:              Appears normal         Bladder:                Appears normal  Heart:                 Previously seen        Spine:                  Previously seen  RVOT:                  Previously seen        Upper Extremities:      Previously seen  LVOT:                  Previously seen        Lower Extremities:      Previously seen ---------------------------------------------------------------------- Cervix Uterus Adnexa  Cervix  Normal appearance by transabdominal scan  Uterus  No abnormality visualized.  Right Ovary  Within normal limits.  Left Ovary  Within normal limits.  Cul De Sac  No free fluid seen.  Adnexa  No abnormality visualized ---------------------------------------------------------------------- Impression  Pregravid BMI 48.  Patient does not have gestational  diabetes.  Blood pressure today at our office is 125/56 mmHg  Amniotic fluid is normal good fetal activity seen.  The  estimated fetal weight is at the 93rd percentile and the  abdominal circumference measurement at the 98th  percentile.  Cephalic presentation.  Antenatal testing is  reassuring.  BPP 8/8. ---------------------------------------------------------------------- Recommendations  Continue weekly antenatal testing till delivery . ----------------------------------------------------------------------                 Fredia Fresh, MD Electronically Signed Final Report   06/05/2024 01:54 pm ----------------------------------------------------------------------    Assessment and Plan:  Pregnancy: G3P1011 at [redacted]w[redacted]d 1. History of gestational hypertension (Primary) 132/85 Patient reports high BPs at home (140s-160/94-107), but had borderline BP of 132/85 during check here in  office.  Normal CMP and CBC on review on [facility] protal as it is not in EPIC yet; urine protein:creatinine still pending.  No symptoms.  Had elevated BP on arrival today, but repeat BP was still borderline.  Given her history of GHTN, and now one documented elevated BP in office, multiple at home, concerned about GHTN now.  This was discussed with patient, preeclampsia precautions given.   Recommended IOL at [redacted] weeks gestation given concern about GHTN/preeclampsia.  She agrees to this plan.  IOL scheduled on 07/06/24 daytime, orders signed and held.  Patient was told that this can be changed if needed depending on her BPs and  symptoms over the next few days.  She will get BP check at [facility] tomorrow (8/20) and then return here for BP checks on 8/21 and 8/22.  Told not to use her home BP cuff for now, given the discrepancy with the office values.    Preeclampsia precautions strictly reviewed.  2. Maternal morbid obesity, antepartum (HCC) Already getting weekly antenatal testing.  3. Large for gestational age fetus affecting mother, third trimester EFW 93% on 06/05/24, repeat growth to be done on 07/03/24  4. [redacted] weeks gestation of pregnancy 5. Supervision of high risk pregnancy, antepartum Pelvic cultures done today.  No evidence of ROM, patient reassured. Favorable cervix on examination. - Cervicovaginal ancillary only - Strep Gp B NAA Preterm labor symptoms and general obstetric precautions including but not limited to vaginal bleeding, contractions, leaking of fluid and fetal movement were reviewed in detail with the patient. Please refer to After Visit Summary for other counseling recommendations.   Return in about 2 days (around 07/04/2024) for RN visit for BP check  on 07/04/24 and 07/05/24.  Future Appointments  Date Time Provider Department Center  07/03/2024 11:00 AM [facility] PROVIDER 1 [facility]  07/03/2024 11:30 AM [facility] US2 [facility]  07/04/2024 10:45 AM [facility] NURSE [facility]  07/05/2024  9:45 AM [facility] NURSE [facility]  07/06/2024  6:45 AM [facility] SCHED ROOM [facility] None  07/09/2024  9:15 AM [facility] NST [facility]  07/09/2024  9:35 AM Fredirick Glenys RAMAN, MD [facility]  10/30/2024 11:00 AM Wellington Curtis LABOR, FNP [facility]    Gloris Hugger, MD

## 2024-07-02 NOTE — Progress Notes (Signed)
 ROB   B/P elevated.  CC: Watery discharge, pressure wants cervix check.

## 2024-07-03 ENCOUNTER — Ambulatory Visit (HOSPITAL_BASED_OUTPATIENT_CLINIC_OR_DEPARTMENT_OTHER): Attending: Obstetrics and Gynecology | Admitting: Obstetrics

## 2024-07-03 ENCOUNTER — Ambulatory Visit

## 2024-07-03 ENCOUNTER — Ambulatory Visit: Payer: Self-pay | Admitting: Obstetrics & Gynecology

## 2024-07-03 VITALS — BP 134/90 | HR 104

## 2024-07-03 DIAGNOSIS — Z3A36 36 weeks gestation of pregnancy: Secondary | ICD-10-CM | POA: Insufficient documentation

## 2024-07-03 DIAGNOSIS — O09293 Supervision of pregnancy with other poor reproductive or obstetric history, third trimester: Secondary | ICD-10-CM

## 2024-07-03 DIAGNOSIS — Z3A37 37 weeks gestation of pregnancy: Secondary | ICD-10-CM | POA: Diagnosis not present

## 2024-07-03 DIAGNOSIS — Z362 Encounter for other antenatal screening follow-up: Secondary | ICD-10-CM | POA: Insufficient documentation

## 2024-07-03 DIAGNOSIS — O3663X0 Maternal care for excessive fetal growth, third trimester, not applicable or unspecified: Secondary | ICD-10-CM

## 2024-07-03 DIAGNOSIS — O99213 Obesity complicating pregnancy, third trimester: Secondary | ICD-10-CM | POA: Diagnosis not present

## 2024-07-03 DIAGNOSIS — O0990 Supervision of high risk pregnancy, unspecified, unspecified trimester: Secondary | ICD-10-CM

## 2024-07-03 DIAGNOSIS — E669 Obesity, unspecified: Secondary | ICD-10-CM

## 2024-07-03 DIAGNOSIS — Z833 Family history of diabetes mellitus: Secondary | ICD-10-CM | POA: Diagnosis not present

## 2024-07-03 DIAGNOSIS — O99210 Obesity complicating pregnancy, unspecified trimester: Secondary | ICD-10-CM

## 2024-07-03 DIAGNOSIS — Z8759 Personal history of other complications of pregnancy, childbirth and the puerperium: Secondary | ICD-10-CM

## 2024-07-03 DIAGNOSIS — O99214 Obesity complicating childbirth: Secondary | ICD-10-CM | POA: Diagnosis not present

## 2024-07-03 DIAGNOSIS — O134 Gestational [pregnancy-induced] hypertension without significant proteinuria, complicating childbirth: Secondary | ICD-10-CM | POA: Diagnosis not present

## 2024-07-03 LAB — CERVICOVAGINAL ANCILLARY ONLY
Chlamydia: NEGATIVE
Comment: NEGATIVE
Comment: NORMAL
Neisseria Gonorrhea: NEGATIVE

## 2024-07-03 NOTE — Progress Notes (Signed)
 MFM Consult Note  Pamela Munoz. [name] is currently at 36 weeks and 6 days.  She has been followed due to maternal obesity with a BMI of 48.     She denies any problems since her last exam.  Her blood pressure today was 134/90.  On today's exam, the overall EFW of 7 pounds 6 ounces measures at the 82nd percentile for her gestational age.    There was normal amniotic fluid noted with a total AFI of 12.31 cm.    A BPP performed today was 8 out of 8.    The fetus was in the vertex presentation.    Due to maternal obesity and gestational hypertension, she is already scheduled for induction in 3 days.    No further exams were scheduled in our office.    The patient stated that all of her questions were answered today.  A total of 10 minutes was spent counseling and coordinating the care for this patient.  Greater than 50% of the time was spent in direct face-to-face contact.

## 2024-07-04 ENCOUNTER — Ambulatory Visit

## 2024-07-04 ENCOUNTER — Encounter: Payer: Self-pay | Admitting: Obstetrics & Gynecology

## 2024-07-04 LAB — STREP GP B NAA: Strep Gp B NAA: NEGATIVE

## 2024-07-05 ENCOUNTER — Telehealth (HOSPITAL_COMMUNITY): Payer: Self-pay | Admitting: *Deleted

## 2024-07-05 ENCOUNTER — Ambulatory Visit

## 2024-07-05 ENCOUNTER — Encounter (HOSPITAL_COMMUNITY): Payer: Self-pay | Admitting: *Deleted

## 2024-07-05 NOTE — Telephone Encounter (Signed)
 Preadmission screen

## 2024-07-06 ENCOUNTER — Inpatient Hospital Stay (HOSPITAL_COMMUNITY): Admitting: Anesthesiology

## 2024-07-06 ENCOUNTER — Encounter (HOSPITAL_COMMUNITY): Payer: Self-pay | Admitting: Obstetrics & Gynecology

## 2024-07-06 ENCOUNTER — Other Ambulatory Visit: Payer: Self-pay

## 2024-07-06 ENCOUNTER — Inpatient Hospital Stay (HOSPITAL_COMMUNITY)
Admission: RE | Admit: 2024-07-06 | Discharge: 2024-07-08 | DRG: 807 | Disposition: A | Discharge status: Home / Self Care | Attending: Obstetrics and Gynecology | Admitting: Obstetrics and Gynecology

## 2024-07-06 ENCOUNTER — Inpatient Hospital Stay (HOSPITAL_COMMUNITY)

## 2024-07-06 DIAGNOSIS — Z833 Family history of diabetes mellitus: Secondary | ICD-10-CM | POA: Diagnosis not present

## 2024-07-06 DIAGNOSIS — Z6841 Body Mass Index (BMI) 40.0 and over, adult: Secondary | ICD-10-CM

## 2024-07-06 DIAGNOSIS — Z8759 Personal history of other complications of pregnancy, childbirth and the puerperium: Secondary | ICD-10-CM

## 2024-07-06 DIAGNOSIS — O99214 Obesity complicating childbirth: Secondary | ICD-10-CM | POA: Diagnosis not present

## 2024-07-06 DIAGNOSIS — O3663X0 Maternal care for excessive fetal growth, third trimester, not applicable or unspecified: Secondary | ICD-10-CM | POA: Diagnosis present

## 2024-07-06 DIAGNOSIS — O139 Gestational [pregnancy-induced] hypertension without significant proteinuria, unspecified trimester: Principal | ICD-10-CM | POA: Diagnosis present

## 2024-07-06 DIAGNOSIS — Z3A37 37 weeks gestation of pregnancy: Secondary | ICD-10-CM

## 2024-07-06 DIAGNOSIS — O134 Gestational [pregnancy-induced] hypertension without significant proteinuria, complicating childbirth: Principal | ICD-10-CM | POA: Diagnosis present

## 2024-07-06 DIAGNOSIS — O133 Gestational [pregnancy-induced] hypertension without significant proteinuria, third trimester: Principal | ICD-10-CM | POA: Diagnosis present

## 2024-07-06 HISTORY — DX: Gestational (pregnancy-induced) hypertension without significant proteinuria, unspecified trimester: O13.9

## 2024-07-06 LAB — CBC
HCT: 39.9 % (ref 36.0–46.0)
Hemoglobin: 13.5 g/dL (ref 12.0–15.0)
MCH: 27.7 pg (ref 26.0–34.0)
MCHC: 33.8 g/dL (ref 30.0–36.0)
MCV: 81.9 fL (ref 80.0–100.0)
Platelets: 211 K/uL (ref 150–400)
RBC: 4.87 MIL/uL (ref 3.87–5.11)
RDW: 14.3 % (ref 11.5–15.5)
WBC: 7.2 K/uL (ref 4.0–10.5)
nRBC: 0 % (ref 0.0–0.2)

## 2024-07-06 LAB — COMPREHENSIVE METABOLIC PANEL WITH GFR
ALT: 14 U/L (ref 0–44)
AST: 22 U/L (ref 15–41)
Albumin: 2.6 g/dL — ABNORMAL LOW (ref 3.5–5.0)
Alkaline Phosphatase: 134 U/L — ABNORMAL HIGH (ref 38–126)
Anion gap: 8 (ref 5–15)
BUN: 7 mg/dL (ref 6–20)
CO2: 22 mmol/L (ref 22–32)
Calcium: 9.5 mg/dL (ref 8.9–10.3)
Chloride: 106 mmol/L (ref 98–111)
Creatinine, Ser: 0.96 mg/dL (ref 0.44–1.00)
GFR, Estimated: >60 mL/min (ref >60)
Glucose, Bld: 97 mg/dL (ref 70–99)
Potassium: 4.2 mmol/L (ref 3.5–5.1)
Sodium: 136 mmol/L (ref 135–145)
Total Bilirubin: 0.6 mg/dL (ref 0.0–1.2)
Total Protein: 6 g/dL — ABNORMAL LOW (ref 6.5–8.1)

## 2024-07-06 LAB — TYPE AND SCREEN
ABO/RH(D): O POS
Antibody Screen: NEGATIVE

## 2024-07-06 MED ORDER — FLEET ENEMA RE ENEM: 1.0000 | ENEMA | Freq: Every day | RECTAL | Status: DC | PRN

## 2024-07-06 MED ORDER — PHENYLEPHRINE 80 MCG/ML (10ML) SYRINGE FOR IV PUSH (FOR BLOOD PRESSURE SUPPORT)
80.0000 ug | PREFILLED_SYRINGE | INTRAVENOUS | Status: DC | PRN
  Administered 2024-07-06 (×2): 80 ug via INTRAVENOUS
  Filled 2024-07-06: qty 10

## 2024-07-06 MED ORDER — LIDOCAINE HCL (PF) 1 % IJ SOLN
INTRAMUSCULAR | Status: DC | PRN
Start: 2024-07-06 — End: 2024-07-07
  Administered 2024-07-06: 3 mL via SUBCUTANEOUS

## 2024-07-06 MED ORDER — OXYTOCIN-SODIUM CHLORIDE 30-0.9 UT/500ML-% IV SOLN
2.5000 [IU]/h | INTRAVENOUS | Status: DC
  Administered 2024-07-07: 2.5 [IU]/h via INTRAVENOUS

## 2024-07-06 MED ORDER — EPHEDRINE 5 MG/ML INJ
10.0000 mg | INTRAVENOUS | Status: DC | PRN
  Administered 2024-07-06: 10 mg via INTRAVENOUS
  Filled 2024-07-06: qty 5

## 2024-07-06 MED ORDER — LIDOCAINE HCL (PF) 1 % IJ SOLN: 30.0000 mL | INTRAMUSCULAR | Status: DC | PRN

## 2024-07-06 MED ORDER — LACTATED RINGERS IV SOLN
500.0000 mL | Freq: Once | INTRAVENOUS | Status: AC
  Administered 2024-07-06: 500 mL via INTRAVENOUS

## 2024-07-06 MED ORDER — MISOPROSTOL 50MCG HALF TABLET: 50.0000 ug | ORAL_TABLET | Freq: Once | ORAL | Status: DC

## 2024-07-06 MED ORDER — DIPHENHYDRAMINE HCL 50 MG/ML IJ SOLN: 12.5000 mg | INTRAMUSCULAR | Status: DC | PRN

## 2024-07-06 MED ORDER — FENTANYL-BUPIVACAINE-NACL 0.5-0.125-0.9 MG/250ML-% EP SOLN
12.0000 mL/h | EPIDURAL | Status: DC | PRN
  Administered 2024-07-06: 12 mL/h via EPIDURAL
  Filled 2024-07-06: qty 250

## 2024-07-06 MED ORDER — LABETALOL HCL 5 MG/ML IV SOLN: 80.0000 mg | INTRAVENOUS | Status: DC | PRN

## 2024-07-06 MED ORDER — LACTATED RINGERS IV SOLN
500.0000 mL | INTRAVENOUS | Status: DC | PRN
  Administered 2024-07-06: 500 mL via INTRAVENOUS

## 2024-07-06 MED ORDER — OXYTOCIN BOLUS FROM INFUSION
333.0000 mL | Freq: Once | INTRAVENOUS | Status: AC
  Administered 2024-07-07: 333 mL via INTRAVENOUS

## 2024-07-06 MED ORDER — PHENYLEPHRINE 80 MCG/ML (10ML) SYRINGE FOR IV PUSH (FOR BLOOD PRESSURE SUPPORT)
80.0000 ug | PREFILLED_SYRINGE | INTRAVENOUS | Status: DC | PRN
  Administered 2024-07-06: 160 ug via INTRAVENOUS

## 2024-07-06 MED ORDER — LABETALOL HCL 5 MG/ML IV SOLN: 20.0000 mg | INTRAVENOUS | Status: DC | PRN

## 2024-07-06 MED ORDER — ZOLPIDEM TARTRATE 5 MG PO TABS: 5.0000 mg | ORAL_TABLET | Freq: Every evening | ORAL | Status: DC | PRN

## 2024-07-06 MED ORDER — LIDOCAINE-EPINEPHRINE (PF) 2 %-1:200000 IJ SOLN
INTRAMUSCULAR | Status: DC | PRN
  Administered 2024-07-06: 3 mL via EPIDURAL

## 2024-07-06 MED ORDER — LABETALOL HCL 5 MG/ML IV SOLN: 40.0000 mg | INTRAVENOUS | Status: DC | PRN

## 2024-07-06 MED ORDER — ONDANSETRON HCL 4 MG/2ML IJ SOLN: 4.0000 mg | Freq: Four times a day (QID) | INTRAMUSCULAR | Status: DC | PRN

## 2024-07-06 MED ORDER — EPHEDRINE 5 MG/ML INJ: 10.0000 mg | INTRAVENOUS | Status: DC | PRN

## 2024-07-06 MED ORDER — ACETAMINOPHEN 325 MG PO TABS: 650.0000 mg | ORAL_TABLET | ORAL | Status: DC | PRN

## 2024-07-06 MED ORDER — SOD CITRATE-CITRIC ACID 500-334 MG/5ML PO SOLN: 30.0000 mL | ORAL | Status: DC | PRN

## 2024-07-06 MED ORDER — LACTATED RINGERS IV SOLN: INTRAVENOUS | Status: DC

## 2024-07-06 MED ORDER — HYDROXYZINE HCL 50 MG PO TABS: 50.0000 mg | ORAL_TABLET | Freq: Four times a day (QID) | ORAL | Status: DC | PRN

## 2024-07-06 MED ORDER — HYDRALAZINE HCL 20 MG/ML IJ SOLN: 10.0000 mg | INTRAMUSCULAR | Status: DC | PRN

## 2024-07-06 MED ORDER — TERBUTALINE SULFATE 1 MG/ML IJ SOLN: 0.2500 mg | Freq: Once | INTRAMUSCULAR | Status: DC | PRN

## 2024-07-06 MED ORDER — FENTANYL CITRATE (PF) 100 MCG/2ML IJ SOLN: 50.0000 ug | INTRAMUSCULAR | Status: DC | PRN

## 2024-07-06 MED ORDER — MISOPROSTOL 25 MCG QUARTER TABLET: 25.0000 ug | ORAL_TABLET | Freq: Once | ORAL | Status: DC

## 2024-07-06 MED ORDER — SODIUM CHLORIDE 0.9 % IV SOLN
INTRAVENOUS | Status: DC | PRN
  Administered 2024-07-06: 6 mL via EPIDURAL

## 2024-07-06 MED ORDER — OXYTOCIN-SODIUM CHLORIDE 30-0.9 UT/500ML-% IV SOLN
1.0000 m[IU]/min | INTRAVENOUS | Status: DC
  Administered 2024-07-06: 2 m[IU]/min via INTRAVENOUS
  Filled 2024-07-06: qty 500

## 2024-07-06 NOTE — H&P (Signed)
 Pamela Munoz is a 28 y.o. female G3P1011 at [redacted]w[redacted]d pt of [facility] presenting for IOL for GHTN.  Pregnancy complicated by BMI 48 prepregnancy, hx GHTN previous pregnancy.   NURSING  PROVIDER  Office Location [facility] Dating by U/S at 6 wks  [facility] Model Traditional Anatomy U/S wnl  Initiated care at  [employer]  English              LAB RESULTS   Support Person FOB Genetics NIPS: LR panorama AFP:     Carrier Screen Horizon:   Rhogam  O/Positive/-- (02/17 1533) A1C/GTT Early HgbA1C: neg Third trimester 2 hr GTT: normal  Flu Vaccine Declined 01/01/24    TDaP Vaccine  Declined 05/14/24 Blood Type O/Positive/-- (02/17 1533)O  RSV Vaccine  Antibody Negative (02/17 1533)  COVID Vaccine Never Rubella 1.40 (02/17 1533)  Feeding Plan Breast RPR Non Reactive (06/03 0914)  Contraception Declines HBsAg Negative (02/17 1533)  Circumcision Yes HIV Non Reactive (06/03 0914)  Pediatrician  [facility] HCVAb Non Reactive (02/17 1533)  Prenatal Classes     BTL Consent  Pap Diagnosis  Date Value Ref Range Status  06/02/2022   Final   - Negative for intraepithelial lesion or malignancy (NILM)    BTL Pre-payment  GC/CT Initial: neg   36wks:    VBAC Consent  GBS  NEGATIVE  BRx Optimized? [ ]  yes   [ ]  no    DME Rx [ X] BP cuff [ ]  Weight Scale Waterbirth  [ ]  Class [ ]  Consent [ ]  CNM visit  PHQ9 & GAD7 [  x] new OB [  ] 28 weeks  [  ] 36 weeks Induction  [ ]  Orders Entered [ ] Foley Y/N    OB History     Gravida  3   Para  1   Term  1   Preterm      AB  1   Living  1      SAB  1   IAB      Ectopic      Multiple  0   Live Births  1          Past Medical History:  Diagnosis Date   Blood transfusion without reported diagnosis    At delivery   History of postpartum hemorrhage, currently pregnant    Hypertension    During pregnancy   Morbid obesity with BMI of 40.0-44.9, adult (HCC)    Osteochondroma 2011   left leg   Pregnancy induced  hypertension    Past Surgical History:  Procedure Laterality Date   ANTERIOR CRUCIATE LIGAMENT REPAIR Right 08/03/2017   Procedure: ANTERIOR CRUCIATE LIGAMENT (ACL) REPAIR;  Surgeon: Tobie Priest, MD;  Location: [facility];  Service: Orthopedics;  Laterality: Right;   KNEE ARTHROSCOPY Right 08/03/2017   Procedure: ARTHROSCOPY KNEERIGHT MEDIAL MENISCUS REPAIR;  Surgeon: Tobie Priest, MD;  Location: [facility];  Service: Orthopedics;  Laterality: Right;  MTF CANNULATED DOWELS- WES CONN STRYKER VERSITOMIC REAMERS +/- INJECTABLE MAP3 ITALY FRYE ARTHREX DILATORS CORING REAMERS +/- DBM ALEX ROBERTS BIOMET REMOVAL DEVICE (POSSIBLE) (UNKNOWN REP-TRYING TO FIND OUT 70 DEGREE ARTHROSCOPE   KNEE ARTHROSCOPY WITH ANTERIOR CRUCIATE LIGAMENT (ACL) REPAIR Right    KNEE SURGERY Left    bone spur   TONSILLECTOMY     WISDOM TOOTH EXTRACTION     Family History: family history includes Diabetes  in her maternal grandfather and maternal grandmother; Endometriosis in her mother. Social History:  reports that she has never smoked. She has never been exposed to tobacco smoke. She has never used smokeless tobacco. She reports that she does not drink alcohol and does not use drugs.     Maternal Diabetes: No Genetic Screening: Normal Maternal Ultrasounds/Referrals: Normal Fetal Ultrasounds or other Referrals:  None Maternal Substance Abuse:  No Significant Maternal Medications:  None Significant Maternal Lab Results:  None Number of Prenatal Visits:greater than 3 verified prenatal visits Maternal Vaccinations: declines Other Comments:  None  Review of Systems  Constitutional:  Negative for chills, fatigue and fever.  Eyes:  Negative for visual disturbance.  Respiratory:  Negative for shortness of breath.   Cardiovascular:  Negative for chest pain.  Gastrointestinal:  Negative for abdominal pain, nausea and vomiting.  Genitourinary:  Negative for difficulty urinating, dysuria, flank pain,  pelvic pain, vaginal bleeding, vaginal discharge and vaginal pain.  Neurological:  Negative for dizziness and headaches.  Psychiatric/Behavioral: Negative.     Maternal Medical History:  Reason for admission: Nausea. IOL GHTN  Contractions: Frequency: irregular.   Perceived severity is mild.   Fetal activity: Perceived fetal activity is normal.   Prenatal complications: PIH.   Prenatal Complications - Diabetes: none.   Dilation: 3 Effacement (%): 50 Station: -2 Exam by:: Voncile, RN Height 5' 6 (1.676 m), weight (!) 144.7 kg, last menstrual period 10/08/2023, unknown if currently breastfeeding. Exam Physical Exam Vitals and nursing note reviewed.  Constitutional:      Appearance: She is well-developed.  Cardiovascular:     Rate and Rhythm: Normal rate.  Pulmonary:     Effort: Pulmonary effort is normal.  Abdominal:     Palpations: Abdomen is soft.  Musculoskeletal:        General: Normal range of motion.     Cervical back: Normal range of motion.  Skin:    General: Skin is warm and dry.  Neurological:     Mental Status: She is alert and oriented to person, place, and time.  Psychiatric:        Behavior: Behavior normal.        Thought Content: Thought content normal.        Judgment: Judgment normal.     Prenatal labs: ABO, Rh: --/--/O POS (08/23 1510) Antibody: NEG (08/23 1510) Rubella: 1.40 (02/17 1533) RPR: Non Reactive (06/03 0914)  HBsAg: Negative (02/17 1533)  HIV: Non Reactive (06/03 0914)  GBS: Negative/-- (08/19 0900)   Assessment/Plan: [id number]  at [redacted]w[redacted]d admitted for IOL for GHTN EFW 83% Pitocin  for favorable cervix Anticipate NSVD Plans to breastfeed and use natural family planning/lactational amenorrhea  Olam Boards 07/06/2024, 6:13 PM

## 2024-07-06 NOTE — Anesthesia Procedure Notes (Signed)
 Epidural Patient location during procedure: OB Start time: 07/06/2024 6:10 PM End time: 07/06/2024 6:22 PM  Staffing Anesthesiologist: Boone Fess, MD Performed: anesthesiologist   Preanesthetic Checklist Completed: patient identified, IV checked, site marked, risks and benefits discussed, surgical consent, monitors and equipment checked, pre-op evaluation and timeout performed  Epidural Patient position: sitting Prep: ChloraPrep Patient monitoring: heart rate, continuous pulse ox and blood pressure Approach: midline Location: L3-L4 Injection technique: LOR saline  Needle:  Needle type: Tuohy  Needle gauge: 17 G Needle length: 9 cm Needle insertion depth: 9 cm Catheter type: closed end flexible Catheter size: 19 Gauge Catheter at skin depth: 14 cm Test dose: negative and 1.5% lidocaine  with Epi 1:200 K  Assessment Sensory level: T10 Events: blood not aspirated, no cerebrospinal fluid, injection not painful, no injection resistance, no paresthesia and negative IV test  Additional Notes First/one attempt. Challenging due to body habitus. Pt. Evaluated and documentation done after procedure finished. Patient identified. Risks/Benefits/Options discussed with patient including but not limited to bleeding, infection, nerve damage, paralysis, failed block, incomplete pain control, headache, blood pressure changes, nausea, vomiting, reactions to medication both or allergic, itching and postpartum back pain. Confirmed with bedside nurse the patient's most recent platelet count. Confirmed with patient that they are not currently taking any anticoagulation, have any bleeding history or any family history of bleeding disorders. Patient expressed understanding and wished to proceed. All questions were answered. Sterile technique was used throughout the entire procedure. Please see nursing notes for vital signs. Test dose was given through epidural catheter and negative prior to continuing to  dose epidural or start infusion. Warning signs of high block given to the patient including shortness of breath, tingling/numbness in hands, complete motor block, or any concerning symptoms with instructions to call for help. Patient was given instructions on fall risk and not to get out of bed. All questions and concerns addressed with instructions to call with any issues or inadequate analgesia.     Patient tolerated the insertion well without immediate complications.  Reason for block: procedure for painReason for block:procedure for pain

## 2024-07-06 NOTE — Progress Notes (Signed)
 Labor progress note:  At bedside to re-eval. Contracting regularly, though has periods with four contractions q45m, then a 2-4 min gap between ctx. Overall MVUs are adequate. Cat I tracing. Cx much thinner, now 5/90/-2. Baby asynclitic. Will work on position changes to help w fetal positioning.   BP have been normotensive. No sxs.  Plan for TXA at delivery given hx PPH.  Alain Sor, MD OB Fellow, Faculty Practice [facility], Center for [facility]

## 2024-07-06 NOTE — Progress Notes (Signed)
 Pamela Munoz is a 28 y.o. G3P1011 at [redacted]w[redacted]d admitted for IOL for GHTN.  Subjective: Pt comfortable with epidural.  S/O in room for support.  Objective: BP (!) 124/59   Pulse 92   Ht 5' 6 (1.676 m)   Wt (!) 144.7 kg   LMP 10/08/2023   SpO2 100%   BMI 51.49 kg/m  No intake/output data recorded. No intake/output data recorded.  FHT:  FHR: 135 bpm, variability: moderate,  accelerations:  Present,  decelerations:  Absent UC:   regular, every 3-5 minutes SVE:   Dilation: 4 Effacement (%): 70 Station: -2 Exam by:: Olam, CNM AROM with clear fluid. Pt tolerated well.  Labs: Lab Results  Component Value Date   WBC 7.2 07/06/2024   HGB 13.5 07/06/2024   HCT 39.9 07/06/2024   MCV 81.9 07/06/2024   PLT 211 07/06/2024    Assessment / Plan: IOL for GHTN Pitocin  AROM  Labor: Discussed options including continuing Pitocin  alone and AROM plus Pitocin . Pt opts for AROM.  IUPC placed at this time as well.  Preeclampsia:  labs stable Fetal Wellbeing:  Category I Pain Control:  Epidural I/D:  GBS neg Anticipated MOD:  NSVD  Olam Boards, CNM 07/06/2024, 8:40 PM

## 2024-07-06 NOTE — Progress Notes (Signed)
 Labor Progress Note Pamela Munoz is a 28 y.o. G3P1011 at [redacted]w[redacted]d presented for IOL due to gHTN  S: Comfortable w epidural, family at bedside  O:  BP 131/74   Pulse 96   Ht 5' 6 (1.676 m)   Wt (!) 144.7 kg   LMP 10/08/2023   SpO2 99%   BMI 51.49 kg/m  EFM: 125/mod/+a/-d  CVE: Dilation: 4 Effacement (%): 70 Station: -2 Presentation: Vertex Exam by:: Olam, CNM   A&P: 28 y.o. [id number] [redacted]w[redacted]d here for IOL  #Labor: Progressing well. S/p AROM, on Pitocin , titrating Pitocin  to adequate MVUs #Pain: Epidural #FWB: Cat I #GBS negative  #LGA infant: EFW 3341g - 82%, AC 88%tile at [redacted]w[redacted]d  pelvis proven to 3400g  #gHTN: BP wnl  PEC w/up wnl  #BMI 51  Alain Sor, MD 9:26 PM

## 2024-07-06 NOTE — Anesthesia Preprocedure Evaluation (Signed)
 Anesthesia Evaluation  Patient identified by MRN, date of birth, ID band Patient awake    Reviewed: Allergy & Precautions, NPO status , Patient's Chart, lab work & pertinent test results  History of Anesthesia Complications Negative for: history of anesthetic complications  Airway Mallampati: III  TM Distance: >3 FB Neck ROM: Full    Dental no notable dental hx. (+) Teeth Intact   Pulmonary neg pulmonary ROS, neg sleep apnea, neg COPD, Patient abstained from smoking.Not current smoker   Pulmonary exam normal breath sounds clear to auscultation       Cardiovascular Exercise Tolerance: Good METShypertension, (-) CAD and (-) Past MI (-) dysrhythmias  Rhythm:Regular Rate:Normal - Systolic murmurs    Neuro/Psych negative neurological ROS  negative psych ROS   GI/Hepatic ,neg GERD  ,,(+)     (-) substance abuse    Endo/Other  neg diabetes  Class 4 obesity  Renal/GU negative Renal ROS     Musculoskeletal   Abdominal  (+) + obese  Peds  Hematology Denies blood thinner use or bleeding disorders.    Anesthesia Other Findings Denies blood thinner use or bleeding diatheses. Recent labs reviewed. Past Medical History: No date: Blood transfusion without reported diagnosis     Comment:  At delivery No date: History of postpartum hemorrhage, currently pregnant No date: Hypertension     Comment:  During pregnancy No date: Morbid obesity with BMI of 40.0-44.9, adult (HCC) 2011: Osteochondroma     Comment:  left leg No date: Pregnancy induced hypertension   Reproductive/Obstetrics (+) Pregnancy                              Anesthesia Physical Anesthesia Plan  ASA: 3  Anesthesia Plan: Epidural   Post-op Pain Management:    Induction:   PONV Risk Score and Plan: 2 and Treatment may vary due to age or medical condition and Ondansetron   Airway Management Planned: Natural Airway  Additional  Equipment:   Intra-op Plan:   Post-operative Plan:   Informed Consent: I have reviewed the patients History and Physical, chart, labs and discussed the procedure including the risks, benefits and alternatives for the proposed anesthesia with the patient or authorized representative who has indicated his/her understanding and acceptance.       Plan Discussed with: Surgeon  Anesthesia Plan Comments: (Prior epidural was charted as needing 3 attempts due to patient body habitus and poor positioning. Discussed R/B/A of neuraxial anesthesia technique with patient: - rare risks of spinal/epidural hematoma, nerve damage, infection - Risk of PDPH - Risk of itching - Risk of nausea and vomiting - Risk of poor block necessitating replacement of epidural (higher chance, including inability to place one at all, given high BMI discussed) - Risk of allergic reactions. Patient voiced understanding.)        Anesthesia Quick Evaluation

## 2024-07-07 DIAGNOSIS — Z3A37 37 weeks gestation of pregnancy: Secondary | ICD-10-CM | POA: Diagnosis not present

## 2024-07-07 DIAGNOSIS — O99214 Obesity complicating childbirth: Secondary | ICD-10-CM | POA: Diagnosis not present

## 2024-07-07 DIAGNOSIS — O134 Gestational [pregnancy-induced] hypertension without significant proteinuria, complicating childbirth: Secondary | ICD-10-CM | POA: Diagnosis not present

## 2024-07-07 LAB — RPR: RPR Ser Ql: NONREACTIVE

## 2024-07-07 MED ORDER — SODIUM CHLORIDE 0.9% FLUSH: 3.0000 mL | INTRAVENOUS | Status: DC | PRN

## 2024-07-07 MED ORDER — TETANUS-DIPHTH-ACELL PERTUSSIS 5-2.5-18.5 LF-MCG/0.5 IM SUSY: 0.5000 mL | PREFILLED_SYRINGE | Freq: Once | INTRAMUSCULAR | Status: DC

## 2024-07-07 MED ORDER — NIFEDIPINE ER OSMOTIC RELEASE 30 MG PO TB24
30.0000 mg | ORAL_TABLET | Freq: Every day | ORAL | Status: DC
  Administered 2024-07-07 – 2024-07-08 (×2): 30 mg via ORAL
  Filled 2024-07-07 (×2): qty 1

## 2024-07-07 MED ORDER — ONDANSETRON HCL 4 MG PO TABS: 4.0000 mg | ORAL_TABLET | ORAL | Status: DC | PRN

## 2024-07-07 MED ORDER — DIPHENHYDRAMINE HCL 25 MG PO CAPS: 25.0000 mg | ORAL_CAPSULE | Freq: Four times a day (QID) | ORAL | Status: DC | PRN

## 2024-07-07 MED ORDER — TRANEXAMIC ACID-NACL 1000-0.7 MG/100ML-% IV SOLN
INTRAVENOUS | Status: AC
  Filled 2024-07-07: qty 100

## 2024-07-07 MED ORDER — SODIUM CHLORIDE 0.9% FLUSH: 3.0000 mL | Freq: Two times a day (BID) | INTRAVENOUS | Status: DC

## 2024-07-07 MED ORDER — BENZOCAINE-MENTHOL 20-0.5 % EX AERO
1.0000 | INHALATION_SPRAY | CUTANEOUS | Status: DC | PRN
  Filled 2024-07-07: qty 56

## 2024-07-07 MED ORDER — POTASSIUM CHLORIDE CRYS ER 20 MEQ PO TBCR
40.0000 meq | EXTENDED_RELEASE_TABLET | Freq: Every day | ORAL | Status: DC
  Administered 2024-07-07 – 2024-07-08 (×2): 40 meq via ORAL
  Filled 2024-07-07 (×2): qty 2

## 2024-07-07 MED ORDER — ACETAMINOPHEN 325 MG PO TABS: 650.0000 mg | ORAL_TABLET | ORAL | Status: DC | PRN

## 2024-07-07 MED ORDER — SIMETHICONE 80 MG PO CHEW: 80.0000 mg | CHEWABLE_TABLET | ORAL | Status: DC | PRN

## 2024-07-07 MED ORDER — FUROSEMIDE 20 MG PO TABS
40.0000 mg | ORAL_TABLET | Freq: Every day | ORAL | Status: DC
  Administered 2024-07-07 – 2024-07-08 (×2): 40 mg via ORAL
  Filled 2024-07-07 (×2): qty 2

## 2024-07-07 MED ORDER — SENNOSIDES-DOCUSATE SODIUM 8.6-50 MG PO TABS
2.0000 | ORAL_TABLET | ORAL | Status: DC
  Administered 2024-07-07 – 2024-07-08 (×2): 2 via ORAL
  Filled 2024-07-07 (×2): qty 2

## 2024-07-07 MED ORDER — IBUPROFEN 800 MG PO TABS
800.0000 mg | ORAL_TABLET | Freq: Three times a day (TID) | ORAL | Status: DC
  Administered 2024-07-07 – 2024-07-08 (×5): 800 mg via ORAL
  Filled 2024-07-07 (×5): qty 1

## 2024-07-07 MED ORDER — COCONUT OIL OIL: 1.0000 | TOPICAL_OIL | Status: DC | PRN

## 2024-07-07 MED ORDER — SODIUM CHLORIDE 0.9 % IV SOLN: 250.0000 mL | INTRAVENOUS | Status: DC | PRN

## 2024-07-07 MED ORDER — WITCH HAZEL-GLYCERIN EX PADS: 1.0000 | MEDICATED_PAD | CUTANEOUS | Status: DC | PRN

## 2024-07-07 MED ORDER — PRENATAL MULTIVITAMIN CH
1.0000 | ORAL_TABLET | Freq: Every day | ORAL | Status: DC
Start: 2024-07-07 — End: 2024-07-08
  Administered 2024-07-07 – 2024-07-08 (×2): 1 via ORAL
  Filled 2024-07-07 (×2): qty 1

## 2024-07-07 MED ORDER — TRANEXAMIC ACID-NACL 1000-0.7 MG/100ML-% IV SOLN: 1000.0000 mg | INTRAVENOUS | Status: AC

## 2024-07-07 MED ORDER — DIBUCAINE (PERIANAL) 1 % EX OINT
1.0000 | TOPICAL_OINTMENT | CUTANEOUS | Status: DC | PRN
Start: 2024-07-07 — End: 2024-07-08

## 2024-07-07 MED ORDER — ONDANSETRON HCL 4 MG/2ML IJ SOLN: 4.0000 mg | INTRAMUSCULAR | Status: DC | PRN

## 2024-07-07 NOTE — Lactation Note (Signed)
 This note was copied from a baby's chart. Lactation Consultation Note  Patient Name: Pamela Munoz Unijb'd Date: 07/07/2024 Age:27 hours Reason for consult: Follow-up assessment;Early term 37-38.6wks  P2- MOB reports that infant has been nursing well so far. MOB has no concerns or questions regarding breastfeeding or infant. Infant had just finished having a bath. LC encouraged attempting to latch him with LC. MOB verbalized that she just wanted to cuddle him at this time since he recently ate. MOB verbalized that she will call for a latch assessment tonight. LC reviewed the first 24 hr birthday nap, day 2 cluster feeding, feeding infant on cue 8-12x in 24 hrs, not allowing infant to go over 3 hrs without a feeding, CDC milk storage guidelines, LC services handout and engorgement/breast care. LC encouraged MOB to call for further assistance as needed. [facility] referral sent today.  Maternal Data Has patient been taught Hand Expression?: Yes Does the patient have breastfeeding experience prior to this delivery?: Yes  Feeding Mother's Current Feeding Choice: Breast Milk  Lactation Tools Discussed/Used Pump Education: Milk Storage  Interventions Interventions: Breast feeding basics reviewed;Education;LC Services brochure  Discharge Discharge Education: Engorgement and breast care;Warning signs for feeding baby Pump: Manual;Personal;Referral sent for [facility] Pump  Consult Status Consult Status: Follow-up Date: 07/08/24 Follow-up type: In-patient    Recardo Hoit BS, IBCLC 07/07/2024, 5:57 PM

## 2024-07-07 NOTE — Lactation Note (Signed)
 This note was copied from a baby's chart. Lactation Consultation Note  Patient Name: Pamela Munoz Unijb'd Date: 07/07/2024 Age:27 hours Reason for consult: Initial assessment;Early term 37-38.6wks Mom stated the baby BF well in L&D. Baby was suckling on hands so mom put baby back on the breast. Mom didn't have much luck BF her first child and had to pump a lot but is hoping it will go better w/this baby. Newborn feeding habits, behavior, STS, I&O, positioning, body alignment. Mom encouraged to feed baby 8-12 times/24 hours and with feeding cues.  Baby on the breast when LC came into rm. No swallows heard at this time.  Baby fell asleep at the breast. Answered mom's questions. Mom will call for assistance as needed.  Maternal Data Has patient been taught Hand Expression?: Yes Does the patient have breastfeeding experience prior to this delivery?: Yes How long did the patient breastfeed?: 2-3 months  Feeding    LATCH Score Latch: Grasps breast easily, tongue down, lips flanged, rhythmical sucking.  Audible Swallowing: None  Type of Nipple: Everted at rest and after stimulation  Comfort (Breast/Nipple): Soft / non-tender  Hold (Positioning): Assistance needed to correctly position infant at breast and maintain latch.  LATCH Score: 7   Lactation Tools Discussed/Used    Interventions Interventions: Breast feeding basics reviewed;Skin to skin;Breast compression;Adjust position;Support pillows;Education;LC Services brochure;CDC milk storage guidelines  Discharge    Consult Status Consult Status: Follow-up Date: 07/07/24 Follow-up type: In-patient    Jarrod Bodkins G 07/07/2024, 4:10 AM

## 2024-07-07 NOTE — Anesthesia Postprocedure Evaluation (Signed)
 Anesthesia Post Note  Patient: Pamela Munoz  Procedure(s) Performed: AN AD HOC LABOR EPIDURAL     Patient location during evaluation: Mother Baby Anesthesia Type: Epidural Level of consciousness: awake, awake and alert and oriented Pain management: satisfactory to patient Vital Signs Assessment: post-procedure vital signs reviewed and stable Respiratory status: spontaneous breathing, nonlabored ventilation and respiratory function stable Cardiovascular status: blood pressure returned to baseline and stable Postop Assessment: no headache, no backache, no apparent nausea or vomiting, patient able to bend at knees, adequate PO intake and able to ambulate Anesthetic complications: no   No notable events documented.  Last Vitals:  Vitals:   07/07/24 0415 07/07/24 0836  BP: 138/64 125/85  Pulse: 97 91  Resp: 16 18  Temp: 36.9 C 36.7 C  SpO2: 98%     Last Pain:  Vitals:   07/07/24 0837  TempSrc:   PainSc: 0-No pain   Pain Goal:                Epidural/Spinal Function Cutaneous sensation: Normal sensation (07/07/24 0837), Patient able to flex knees: Yes (07/07/24 0837), Patient able to lift hips off bed: Yes (07/07/24 0837), Back pain beyond tenderness at insertion site: No (07/07/24 0837), Progressively worsening motor and/or sensory loss: No (07/07/24 0837), Bowel and/or bladder incontinence post epidural: No (07/07/24 0837)  Ponce Skillman

## 2024-07-07 NOTE — Discharge Summary (Signed)
 Postpartum Discharge Summary  Date of Service updated***     Patient Name: Pamela Munoz DOB: 06-30-1996 MRN: [id number]  Date of admission: 07/06/2024 Delivery date:07/07/2024 Delivering provider: VON REASONER Date of discharge: 07/07/2024  Admitting diagnosis: Gestational hypertension [O13.9] Intrauterine pregnancy: [redacted]w[redacted]d     Secondary diagnosis:  Principal Problem:   SVD (spontaneous vaginal delivery) Active Problems:   Gestational hypertension, third trimester   BMI 45.0-49.9, adult (HCC)   Large for gestational age fetus affecting mother, third trimester   Gestational hypertension  Additional problems: ***    Discharge diagnosis: Term Pregnancy Delivered and Gestational Hypertension                                              Post partum procedures:{Postpartum procedures:23558} Augmentation: AROM and Pitocin  Complications: {OB Labor/Delivery Complications:20784}  Hospital course: Induction of Labor With Vaginal Delivery   28 y.o. yo G3P1011 at [redacted]w[redacted]d was admitted to the hospital 07/06/2024 for induction of labor.  Indication for induction: Gestational hypertension.  Patient had an labor course complicated by nothing. Membrane Rupture Time/Date: 7:26 PM,07/06/2024  Delivery Method:Vaginal, Spontaneous Operative Delivery:N/A Episiotomy: None Lacerations:  1st degree Details of delivery can be found in separate delivery note.  Patient had a postpartum course complicated by***. Patient is discharged home 07/07/24.  Newborn Data: Birth date:07/07/2024 Birth time:1:14 AM Gender:Female Living status:Living Apgars:9 ,9  Weight:   Magnesium  Sulfate received: {Mag received:30440022} BMZ received: No Rhophylac:N/A MMR:N/A T-DaP:***Declined prenatally Flu: No RSV Vaccine received: No Transfusion:{Transfusion received:30440034}  Immunizations received:  There is no immunization history on file for this patient.  Physical exam  Vitals:   07/07/24 0032 07/07/24  0102 07/07/24 0123 07/07/24 0135  BP: 124/70 132/63 (!) 132/106 136/78  Pulse: (!) 104 98 (!) 120 (!) 114  SpO2:      Weight:      Height:       General: {Exam; general:21111117} Lochia: {Desc; appropriate/inappropriate:30686::appropriate} Uterine Fundus: {Desc; firm/soft:30687} Incision: {Exam; incision:21111123} DVT Evaluation: {Exam; dvt:2111122} Labs: Lab Results  Component Value Date   WBC 7.2 07/06/2024   HGB 13.5 07/06/2024   HCT 39.9 07/06/2024   MCV 81.9 07/06/2024   PLT 211 07/06/2024      Latest Ref Rng & Units 07/06/2024    4:22 PM  CMP  Glucose 70 - 99 mg/dL 97   BUN 6 - 20 mg/dL 7   Creatinine 9.55 - 8.99 mg/dL 9.03   Sodium 864 - 854 mmol/L 136   Potassium 3.5 - 5.1 mmol/L 4.2   Chloride 98 - 111 mmol/L 106   CO2 22 - 32 mmol/L 22   Calcium 8.9 - 10.3 mg/dL 9.5   Total Protein 6.5 - 8.1 g/dL 6.0   Total Bilirubin 0.0 - 1.2 mg/dL 0.6   Alkaline Phos 38 - 126 U/L 134   AST 15 - 41 U/L 22   ALT 0 - 44 U/L 14    Edinburgh Score:    11/08/2022    5:26 PM  Edinburgh Postnatal Depression Scale Screening Tool  I have been able to laugh and see the funny side of things. 0  I have looked forward with enjoyment to things. 0  I have blamed myself unnecessarily when things went wrong. 0  I have been anxious or worried for no good reason. 0  I have felt scared or panicky for no  good reason. 0  Things have been getting on top of me. 0  I have been so unhappy that I have had difficulty sleeping. 0  I have felt sad or miserable. 0  I have been so unhappy that I have been crying. 0  The thought of harming myself has occurred to me. 0  Edinburgh Postnatal Depression Scale Total 0      Data saved with a previous flowsheet row definition   No data recorded  After visit meds:  Allergies as of 07/07/2024       Reactions   Oxycodone  Itching     Med Rec must be completed prior to using this [facility]***        Discharge home in stable condition Infant  Feeding: {Baby feeding:23562} Infant Disposition:{CHL IP OB HOME WITH FNUYZM:76418} Discharge instruction: per After Visit Summary and Postpartum booklet. Activity: Advance as tolerated. Pelvic rest for 6 weeks.  Diet: {OB ipzu:78888878} Future Appointments: Future Appointments  Date Time Provider Department Center  10/30/2024 11:00 AM Wellington Curtis LABOR, FNP [facility]   Follow up Visit: Message sent to [facility] 8/24  Please schedule this patient for a In person postpartum visit in 6 weeks with the following provider: Any provider. Additional Postpartum F/U:BP check 1 week  High risk pregnancy complicated by: HTN Delivery mode:  Vaginal, Spontaneous Anticipated Birth Control:  None   07/07/2024 Alain Sor, MD

## 2024-07-08 ENCOUNTER — Other Ambulatory Visit (HOSPITAL_COMMUNITY): Payer: Self-pay

## 2024-07-08 MED ORDER — POTASSIUM CHLORIDE CRYS ER 20 MEQ PO TBCR
40.0000 meq | EXTENDED_RELEASE_TABLET | Freq: Every day | ORAL | 0 refills | Status: DC
  Filled 2024-07-08: qty 6, 3d supply, fill #0

## 2024-07-08 MED ORDER — BENZOCAINE-MENTHOL 20-0.5 % EX AERO
1.0000 | INHALATION_SPRAY | CUTANEOUS | 3 refills | Status: AC | PRN
  Filled 2024-07-08: qty 78, fill #0

## 2024-07-08 MED ORDER — IBUPROFEN 800 MG PO TABS
800.0000 mg | ORAL_TABLET | Freq: Three times a day (TID) | ORAL | 0 refills | Status: AC
  Filled 2024-07-08: qty 30, 10d supply, fill #0

## 2024-07-08 MED ORDER — FUROSEMIDE 40 MG PO TABS
40.0000 mg | ORAL_TABLET | Freq: Every day | ORAL | 0 refills | Status: AC
  Filled 2024-07-08: qty 6, 6d supply, fill #0

## 2024-07-08 MED ORDER — NIFEDIPINE ER 30 MG PO TB24
30.0000 mg | ORAL_TABLET | Freq: Every day | ORAL | 0 refills | Status: AC
  Filled 2024-07-08: qty 30, 30d supply, fill #0

## 2024-07-08 MED ORDER — POTASSIUM CHLORIDE CRYS ER 20 MEQ PO TBCR
40.0000 meq | EXTENDED_RELEASE_TABLET | Freq: Every day | ORAL | 0 refills | Status: AC
  Filled 2024-07-08: qty 12, 6d supply, fill #0

## 2024-07-08 MED ORDER — SENNOSIDES-DOCUSATE SODIUM 8.6-50 MG PO TABS
2.0000 | ORAL_TABLET | ORAL | 0 refills | Status: AC
  Filled 2024-07-08: qty 60, 30d supply, fill #0

## 2024-07-08 NOTE — Patient Instructions (Signed)
 If interested in an outpatient lactation consult in office or virtually please reach out to us  at [facility] (First Floor) [address], [place] Please call [phone] and press [phone] for lactation.   -- Lactation support groups:  [facility], Tuesdays 10:00 am -12:00 pm at [address] on the second floor in the conference room, lactating parents and lap babies welcome.  [email or web address]  [email or web address]    Vermell CINDERELLA Pelt, [facility] for [facility]

## 2024-07-08 NOTE — Lactation Note (Signed)
 This note was copied from a baby's chart. Lactation Consultation Note  Patient Name: Pamela Munoz Unijb'd Date: 07/08/2024 Age:27 hours, P2  Reason for consult: Follow-up assessment;Early term 37-38.6wks;Infant weight loss;Nipple pain/trauma (6 % weight loss, Early D/C and baby going for a circ) Per mom baby just fed 30 mins with swallows.  LC reviewed breast feeding D/C teaching and the [facility] resources.  LC stressed the importance of sore nipple and engorgement prevention and tx. LC provided breast shells while awake between feedings until sensitivity improves, when LC assisted with the breast  shells both nipples clear of break down.  Stork pump has been approved, waiting for the DEBP. Mom aware to call out by 3:30 pm if she hasn't received it for the [facility] .   Maternal Data - per  mom with her 1st baby struggled with breast feeding and did not breast feed long. Milk did come in.  Has patient been taught Hand Expression?: Yes Does the patient have breastfeeding experience prior to this delivery?: Yes  Feeding Mother's Current Feeding Choice: Breast Milk  LATCH Score - 8     Lactation Tools Discussed/Used Tools: Shells;Flanges (mom brought her own hand pump) Flange Size: 18;21 Pump Education: Setup, frequency, and cleaning;Milk Storage Reason for Pumping: PRN  Interventions Interventions: Breast feeding basics reviewed;Shells;Education;LC Services brochure;CDC milk storage guidelines;CDC Guidelines for Breast Pump Cleaning  Discharge Discharge Education: Engorgement and breast care;Warning signs for feeding baby Pump: Manual;Personal;Referral sent for Stork Pump (stork pump in route for delivery) WIC Program: No  Consult Status Consult Status: Complete Date: 07/08/24    Rollene Jenkins Fiedler 07/08/2024, 2:07 PM

## 2024-07-08 NOTE — Progress Notes (Addendum)
 POSTPARTUM PROGRESS NOTE  Post Partum Day 1  Subjective:  Pamela Munoz is a 28 y.o. G3P1011 s/p SVD at [redacted]w[redacted]d.  No acute events overnight.  Pt denies problems with ambulating, voiding or po intake.  She denies nausea or vomiting.  Pain is well controlled.  She has had flatus. She has not had bowel movement.  Lochia Small.   Objective: Blood pressure 124/81, pulse 82, temperature 97.8 F (36.6 C), temperature source Oral, resp. rate 16, height 5' 6 (1.676 m), weight (!) 144.7 kg, last menstrual period 10/08/2023, SpO2 100%, unknown if currently breastfeeding.  Physical Exam:  General: alert, cooperative and no distress Chest: no respiratory distress Heart:regular rate, distal pulses intact Abdomen: soft, nontender,  Uterine Fundus: firm, appropriately tender DVT Evaluation: No calf swelling or tenderness Extremities: No edema Skin: warm, dry  Recent Labs    07/06/24 1513  HGB 13.5  HCT 39.9    Assessment/Plan: Pamela Munoz is a 28 y.o. G3P1011 s/p SVD at [redacted]w[redacted]d   PPD#1 - Doing well gHTN: continue on lasix , potassium and procardia  30mg  Contraception: natural/lactational amenorrhea Feeding: breast Dispo: Plan for discharge 8/25.   LOS: 2 days   Shana Squires, MD, 07/08/2024, 6:08 AM   Attestation of Supervision of Resident:  I confirm that I have verified the information documented in the resident's note and that I have also personally reperformed the history, physical exam and all medical decision making activities.  I have verified that all services and findings are accurately documented in this resident's note; and I agree with management and plan as outlined in the documentation. I have also made any necessary editorial changes.   Alain Sor, MD OB Fellow, Faculty Practice [facility], Center for [facility]

## 2024-07-08 NOTE — Lactation Note (Signed)
 This note was copied from a baby's chart. Lactation Consultation Note  Patient Name: Pamela Munoz Unijb'd Date: 07/08/2024 Age:27 hours Reason for consult:  (per Dorian the 4 S secretary - [address] received her LENOR December)   Consult Status Consult Status: Complete Date: 07/08/24    Rollene Jenkins Fiedler 07/08/2024, 5:16 PM

## 2024-07-09 ENCOUNTER — Other Ambulatory Visit

## 2024-07-09 ENCOUNTER — Encounter: Admitting: Family Medicine

## 2024-07-10 LAB — BIRTH TISSUE RECOVERY COLLECTION (PLACENTA DONATION)

## 2024-07-11 ENCOUNTER — Encounter: Payer: Self-pay | Admitting: Obstetrics and Gynecology

## 2024-07-16 ENCOUNTER — Ambulatory Visit (INDEPENDENT_AMBULATORY_CARE_PROVIDER_SITE_OTHER)

## 2024-07-16 VITALS — BP 126/85 | HR 76

## 2024-07-16 DIAGNOSIS — Z0130 Encounter for examination of blood pressure without abnormal findings: Secondary | ICD-10-CM

## 2024-07-16 NOTE — Progress Notes (Signed)
 Subjective:  Pamela Munoz is a [id number] here for BP check.  She is 1 week postpartum following a normal spontaneous vaginal delivery.    Hypertension ROS: Patient denies headache and visual changes.   Objective:  BP 126/85 (BP Location: Left Arm, Patient Position: Sitting, Cuff Size: Large)   Pulse 76   LMP 10/08/2023   Appearance alert, well appearing, and in no distress.  Assessment:   Blood Pressure well controlled.   Plan:  Follow up as needed or at postpartum appt.   Shawnee Fleet, CMA

## 2024-08-20 ENCOUNTER — Ambulatory Visit: Admitting: Obstetrics & Gynecology

## 2024-08-20 ENCOUNTER — Encounter: Payer: Self-pay | Admitting: Obstetrics & Gynecology

## 2024-08-20 DIAGNOSIS — O139 Gestational [pregnancy-induced] hypertension without significant proteinuria, unspecified trimester: Secondary | ICD-10-CM

## 2024-08-20 DIAGNOSIS — Z1331 Encounter for screening for depression: Secondary | ICD-10-CM

## 2024-08-20 NOTE — Progress Notes (Signed)
 Post Partum Visit Note  Pamela Munoz is a 28 y.o. G68P1011 female who presents for a postpartum visit. She is 6 weeks postpartum following a normal spontaneous vaginal delivery.  I have fully reviewed the prenatal and intrapartum course. The delivery was at 37 gestational weeks after IOL for gestational HTN.  Anesthesia: epidural. Postpartum course has been really good. Baby is doing well. Baby is feeding by breast. Bleeding no bleeding. Bowel function is normal. Bladder function is normal. Patient is sexually active. Contraception method is none. Postpartum depression screening: negative.  The pregnancy intention screening data noted above was reviewed. Potential methods of contraception were discussed. The patient elected to proceed with condoms.   Edinburgh Postnatal Depression Scale - 08/20/24 1355       Edinburgh Postnatal Depression Scale:  In the Past 7 Days   I have been able to laugh and see the funny side of things. 0    I have looked forward with enjoyment to things. 0    I have blamed myself unnecessarily when things went wrong. 0    I have been anxious or worried for no good reason. 0    I have felt scared or panicky for no good reason. 0    Things have been getting on top of me. 0    I have been so unhappy that I have had difficulty sleeping. 0    I have felt sad or miserable. 0    I have been so unhappy that I have been crying. 0    The thought of harming myself has occurred to me. 0    Edinburgh Postnatal Depression Scale Total 0          Health Maintenance Due  Topic Date Due   DTaP/Tdap/Td (1 - Tdap) Never done   Hepatitis B Vaccines 19-59 Average Risk (1 of 3 - 19+ 3-dose series) Never done   Influenza Vaccine  Never done   COVID-19 Vaccine (1 - 2024-25 season) Never done   HPV VACCINES (1 - 3-dose SCDM series) 07/07/2024    The following portions of the patient's history were reviewed and updated as appropriate: allergies, current medications, past  family history, past medical history, past social history, past surgical history, and problem list.  Review of Systems Pertinent items noted in HPI and remainder of comprehensive ROS otherwise negative.  Objective:  BP 111/76 (BP Location: Right Arm, Patient Position: Sitting, Cuff Size: Large)   Pulse 65   Ht 5' 6 (1.676 m)   Wt (!) 305 lb 3.2 oz (138.4 kg)   LMP 10/08/2023 (Exact Date)   Breastfeeding Yes   BMI 49.26 kg/m    General:  alert and no distress   Breasts:  not indicated  Lungs: clear to auscultation bilaterally  Heart:  regular rate and rhythm  Abdomen: soft, non-tender; bowel sounds normal; no masses,  no organomegaly   GU exam:  not indicated       Assessment:   Normal postpartum exam.   Plan:   Essential components of care per ACOG recommendations:  1.  Mood and well being: Patient with negative depression screening today. Reviewed local resources for support.  - Patient tobacco use? No.   - hx of drug use? No.    2. Infant care and feeding:  -Patient currently breastmilk feeding? Yes. Reviewed importance of draining breast regularly to support lactation.  -Social determinants of health (SDOH) reviewed in EPIC. No concerns.  3. Sexuality, contraception and birth spacing -  Patient does not want a pregnancy in the next year.  Desired family size is 3-4 children.  - Reviewed reproductive life planning. Reviewed contraceptive methods based on pt preferences and effectiveness.  Patient desired FAM or LAM and Female Condom today.   - Discussed birth spacing of 18 months  4. Sleep and fatigue -Encouraged family/partner/community support of 4 hrs of uninterrupted sleep to help with mood and fatigue  5. Physical Recovery  - Discussed patients delivery and complications. She describes her labor as good. - Patient had a Vaginal, no problems at delivery. Patient had a 1st degree laceration. Perineal healing reviewed. Patient expressed understanding - Patient has  urinary incontinence? No. - Patient is safe to resume physical and sexual activity  6.  Health Maintenance - HM due items addressed Yes - Last pap smear  Diagnosis  Date Value Ref Range Status  06/02/2022   Final   - Negative for intraepithelial lesion or malignancy (NILM)   Pap smear not done at today's visit.  -Breast Cancer screening indicated? No.   7. Chronic Disease/Pregnancy Condition follow up: Hypertension - BP stable - PCP follow up   Merrik Puebla, MD Center for [facility], [facility]

## 2024-08-21 DIAGNOSIS — Z0130 Encounter for examination of blood pressure without abnormal findings: Secondary | ICD-10-CM | POA: Insufficient documentation

## 2024-10-30 ENCOUNTER — Ambulatory Visit: Admitting: Family Medicine

## 2024-10-30 ENCOUNTER — Encounter

## 2024-11-12 ENCOUNTER — Encounter
